# Patient Record
Sex: Female | Born: 1985 | Race: White | Hispanic: No | Marital: Single | Smoking: Former smoker
Health system: Southern US, Community
[De-identification: ages and names within clinical notes are randomized; demographics above are authoritative.]

## PROBLEM LIST (undated history)

## (undated) ENCOUNTER — Inpatient Hospital Stay (HOSPITAL_COMMUNITY): Payer: Self-pay

## (undated) DIAGNOSIS — R011 Cardiac murmur, unspecified: Secondary | ICD-10-CM

## (undated) DIAGNOSIS — T4145XA Adverse effect of unspecified anesthetic, initial encounter: Secondary | ICD-10-CM

## (undated) DIAGNOSIS — T8859XA Other complications of anesthesia, initial encounter: Secondary | ICD-10-CM

## (undated) DIAGNOSIS — Z8781 Personal history of (healed) traumatic fracture: Secondary | ICD-10-CM

## (undated) HISTORY — DX: Personal history of (healed) traumatic fracture: Z87.81

## (undated) HISTORY — DX: Adverse effect of unspecified anesthetic, initial encounter: T41.45XA

## (undated) HISTORY — PX: MOUTH SURGERY: SHX715

## (undated) HISTORY — DX: Other complications of anesthesia, initial encounter: T88.59XA

## (undated) HISTORY — PX: DENTAL SURGERY: SHX609

---

## 2005-06-27 ENCOUNTER — Inpatient Hospital Stay (HOSPITAL_COMMUNITY): Admission: AD | Admit: 2005-06-27 | Discharge: 2005-06-27 | Payer: Self-pay | Admitting: *Deleted

## 2006-11-26 ENCOUNTER — Emergency Department (HOSPITAL_COMMUNITY): Admission: EM | Admit: 2006-11-26 | Discharge: 2006-11-26 | Payer: Self-pay | Admitting: Emergency Medicine

## 2006-12-02 DIAGNOSIS — N883 Incompetence of cervix uteri: Secondary | ICD-10-CM

## 2007-08-09 ENCOUNTER — Emergency Department (HOSPITAL_COMMUNITY): Admission: EM | Admit: 2007-08-09 | Discharge: 2007-08-09 | Payer: Self-pay | Admitting: Emergency Medicine

## 2007-12-04 ENCOUNTER — Emergency Department (HOSPITAL_COMMUNITY): Admission: EM | Admit: 2007-12-04 | Discharge: 2007-12-04 | Payer: Self-pay | Admitting: Family Medicine

## 2008-07-10 ENCOUNTER — Emergency Department (HOSPITAL_COMMUNITY): Admission: EM | Admit: 2008-07-10 | Discharge: 2008-07-10 | Payer: Self-pay | Admitting: Emergency Medicine

## 2008-08-13 ENCOUNTER — Inpatient Hospital Stay (HOSPITAL_COMMUNITY): Admission: AD | Admit: 2008-08-13 | Discharge: 2008-08-14 | Payer: Self-pay | Admitting: Obstetrics and Gynecology

## 2008-10-08 ENCOUNTER — Inpatient Hospital Stay (HOSPITAL_COMMUNITY): Admission: AD | Admit: 2008-10-08 | Discharge: 2008-10-08 | Payer: Self-pay | Admitting: Obstetrics and Gynecology

## 2008-11-10 ENCOUNTER — Ambulatory Visit (HOSPITAL_COMMUNITY): Admission: RE | Admit: 2008-11-10 | Discharge: 2008-11-10 | Payer: Self-pay | Admitting: Obstetrics

## 2008-12-02 DIAGNOSIS — O321XX0 Maternal care for breech presentation, not applicable or unspecified: Secondary | ICD-10-CM

## 2008-12-11 ENCOUNTER — Inpatient Hospital Stay (HOSPITAL_COMMUNITY): Admission: AD | Admit: 2008-12-11 | Discharge: 2008-12-11 | Payer: Self-pay | Admitting: Obstetrics

## 2008-12-20 ENCOUNTER — Inpatient Hospital Stay (HOSPITAL_COMMUNITY): Admission: AD | Admit: 2008-12-20 | Discharge: 2008-12-20 | Payer: Self-pay | Admitting: Obstetrics

## 2009-01-03 ENCOUNTER — Emergency Department (HOSPITAL_COMMUNITY): Admission: EM | Admit: 2009-01-03 | Discharge: 2009-01-03 | Payer: Self-pay | Admitting: Family Medicine

## 2009-01-05 ENCOUNTER — Inpatient Hospital Stay (HOSPITAL_COMMUNITY): Admission: AD | Admit: 2009-01-05 | Discharge: 2009-01-05 | Payer: Self-pay | Admitting: Obstetrics

## 2009-01-31 ENCOUNTER — Inpatient Hospital Stay (HOSPITAL_COMMUNITY): Admission: AD | Admit: 2009-01-31 | Discharge: 2009-02-05 | Payer: Self-pay | Admitting: Obstetrics

## 2009-02-02 ENCOUNTER — Ambulatory Visit (HOSPITAL_COMMUNITY): Admission: RE | Admit: 2009-02-02 | Discharge: 2009-02-02 | Payer: Self-pay | Admitting: Obstetrics

## 2009-02-14 ENCOUNTER — Inpatient Hospital Stay (HOSPITAL_COMMUNITY): Admission: AD | Admit: 2009-02-14 | Discharge: 2009-02-14 | Payer: Self-pay | Admitting: Obstetrics

## 2009-03-11 ENCOUNTER — Inpatient Hospital Stay (HOSPITAL_COMMUNITY): Admission: AD | Admit: 2009-03-11 | Discharge: 2009-03-13 | Payer: Self-pay | Admitting: Obstetrics

## 2009-03-31 ENCOUNTER — Emergency Department (HOSPITAL_COMMUNITY): Admission: EM | Admit: 2009-03-31 | Discharge: 2009-03-31 | Payer: Self-pay | Admitting: Family Medicine

## 2010-02-06 ENCOUNTER — Emergency Department (HOSPITAL_COMMUNITY): Admission: EM | Admit: 2010-02-06 | Discharge: 2010-02-06 | Payer: Self-pay | Admitting: Family Medicine

## 2010-05-04 ENCOUNTER — Emergency Department (HOSPITAL_COMMUNITY): Admission: EM | Admit: 2010-05-04 | Discharge: 2010-05-04 | Payer: Self-pay | Admitting: Emergency Medicine

## 2010-05-07 ENCOUNTER — Emergency Department (HOSPITAL_COMMUNITY): Admission: EM | Admit: 2010-05-07 | Discharge: 2010-05-07 | Payer: Self-pay | Admitting: Family Medicine

## 2010-06-06 ENCOUNTER — Emergency Department (HOSPITAL_COMMUNITY): Admission: EM | Admit: 2010-06-06 | Discharge: 2010-06-06 | Payer: Self-pay | Admitting: Family Medicine

## 2010-06-07 ENCOUNTER — Ambulatory Visit (HOSPITAL_COMMUNITY): Admission: RE | Admit: 2010-06-07 | Discharge: 2010-06-07 | Payer: Self-pay | Admitting: Obstetrics

## 2010-06-20 ENCOUNTER — Inpatient Hospital Stay (HOSPITAL_COMMUNITY): Admission: AD | Admit: 2010-06-20 | Discharge: 2010-06-20 | Payer: Self-pay | Admitting: Obstetrics

## 2010-07-11 ENCOUNTER — Ambulatory Visit: Payer: Self-pay | Admitting: Obstetrics and Gynecology

## 2010-07-11 ENCOUNTER — Inpatient Hospital Stay (HOSPITAL_COMMUNITY): Admission: AD | Admit: 2010-07-11 | Discharge: 2010-07-11 | Payer: Self-pay | Admitting: Obstetrics

## 2010-08-14 ENCOUNTER — Inpatient Hospital Stay (HOSPITAL_COMMUNITY): Admission: AD | Admit: 2010-08-14 | Discharge: 2010-08-14 | Payer: Self-pay | Admitting: Obstetrics

## 2010-09-09 ENCOUNTER — Inpatient Hospital Stay (HOSPITAL_COMMUNITY): Admission: AD | Admit: 2010-09-09 | Discharge: 2010-09-09 | Payer: Self-pay | Admitting: Obstetrics

## 2010-09-19 ENCOUNTER — Inpatient Hospital Stay (HOSPITAL_COMMUNITY): Admission: AD | Admit: 2010-09-19 | Discharge: 2010-09-20 | Payer: Self-pay | Admitting: Obstetrics

## 2010-09-19 DIAGNOSIS — W1809XA Striking against other object with subsequent fall, initial encounter: Secondary | ICD-10-CM

## 2010-09-19 DIAGNOSIS — R109 Unspecified abdominal pain: Secondary | ICD-10-CM

## 2010-09-19 DIAGNOSIS — O9989 Other specified diseases and conditions complicating pregnancy, childbirth and the puerperium: Secondary | ICD-10-CM

## 2010-09-19 DIAGNOSIS — M545 Low back pain: Secondary | ICD-10-CM

## 2010-09-28 ENCOUNTER — Emergency Department (HOSPITAL_COMMUNITY): Admission: EM | Admit: 2010-09-28 | Discharge: 2010-09-28 | Payer: Self-pay | Admitting: Emergency Medicine

## 2010-09-28 ENCOUNTER — Emergency Department (HOSPITAL_COMMUNITY): Admission: EM | Admit: 2010-09-28 | Discharge: 2010-09-28 | Payer: Self-pay | Admitting: Family Medicine

## 2010-10-31 ENCOUNTER — Ambulatory Visit (HOSPITAL_COMMUNITY)
Admission: RE | Admit: 2010-10-31 | Discharge: 2010-10-31 | Payer: Self-pay | Source: Home / Self Care | Admitting: Obstetrics

## 2010-12-02 ENCOUNTER — Inpatient Hospital Stay (HOSPITAL_COMMUNITY)
Admission: AD | Admit: 2010-12-02 | Discharge: 2010-12-02 | Payer: Self-pay | Source: Home / Self Care | Attending: Obstetrics | Admitting: Obstetrics

## 2010-12-31 ENCOUNTER — Inpatient Hospital Stay (HOSPITAL_COMMUNITY)
Admission: AD | Admit: 2010-12-31 | Discharge: 2011-01-02 | DRG: 775 | Disposition: A | Discharge status: Home / Self Care | Payer: Medicaid Other | Attending: Obstetrics | Admitting: Obstetrics

## 2010-12-31 LAB — CBC
HCT: 38.5 % (ref 36.0–46.0)
Hemoglobin: 13 g/dL (ref 12.0–15.0)
MCH: 29.3 pg (ref 26.0–34.0)
MCHC: 33.8 g/dL (ref 30.0–36.0)
MCV: 86.9 fL (ref 78.0–100.0)
Platelets: 247 10*3/uL (ref 150–400)
RBC: 4.43 MIL/uL (ref 3.87–5.11)
RDW: 13.5 % (ref 11.5–15.5)
WBC: 13.2 10*3/uL — ABNORMAL HIGH (ref 4.0–10.5)

## 2010-12-31 LAB — RPR: RPR Ser Ql: NONREACTIVE

## 2011-01-01 LAB — CBC
HCT: 32.1 % — ABNORMAL LOW (ref 36.0–46.0)
Hemoglobin: 10.2 g/dL — ABNORMAL LOW (ref 12.0–15.0)
MCH: 28.2 pg (ref 26.0–34.0)
MCHC: 31.8 g/dL (ref 30.0–36.0)
MCV: 88.7 fL (ref 78.0–100.0)
Platelets: 216 10*3/uL (ref 150–400)
RBC: 3.62 MIL/uL — ABNORMAL LOW (ref 3.87–5.11)
RDW: 13.9 % (ref 11.5–15.5)
WBC: 9.1 10*3/uL (ref 4.0–10.5)

## 2011-01-02 LAB — RH IMMUNE GLOB WKUP(>/=20WKS)(NOT WOMEN'S HOSP)
Fetal Screen: NEGATIVE
Unit division: 0

## 2011-02-11 LAB — URINALYSIS, ROUTINE W REFLEX MICROSCOPIC
Bilirubin Urine: NEGATIVE
Glucose, UA: NEGATIVE mg/dL
Hgb urine dipstick: NEGATIVE
Ketones, ur: NEGATIVE mg/dL
Nitrite: NEGATIVE
Protein, ur: NEGATIVE mg/dL
Specific Gravity, Urine: 1.02 (ref 1.005–1.030)
Urobilinogen, UA: 0.2 mg/dL (ref 0.0–1.0)
pH: 7 (ref 5.0–8.0)

## 2011-02-12 LAB — RH IMMUNE GLOBULIN WORKUP (NOT WOMEN'S HOSP)
ABO/RH(D): O NEG
Antibody Screen: NEGATIVE
Unit division: 0

## 2011-02-13 LAB — URINE CULTURE: Culture  Setup Time: 201110282050

## 2011-02-13 LAB — POCT I-STAT, CHEM 8
BUN: 5 mg/dL — ABNORMAL LOW (ref 6–23)
Creatinine, Ser: 0.7 mg/dL (ref 0.4–1.2)
Glucose, Bld: 87 mg/dL (ref 70–99)
Hemoglobin: 11.6 g/dL — ABNORMAL LOW (ref 12.0–15.0)
Potassium: 3.6 mEq/L (ref 3.5–5.1)

## 2011-02-13 LAB — URINALYSIS, ROUTINE W REFLEX MICROSCOPIC
Bilirubin Urine: NEGATIVE
Ketones, ur: NEGATIVE mg/dL
Nitrite: NEGATIVE
Protein, ur: NEGATIVE mg/dL
Urobilinogen, UA: 1 mg/dL (ref 0.0–1.0)
pH: 7 (ref 5.0–8.0)

## 2011-02-13 LAB — POCT URINALYSIS DIPSTICK
Hgb urine dipstick: NEGATIVE
Nitrite: NEGATIVE
Specific Gravity, Urine: 1.015 (ref 1.005–1.030)
Urobilinogen, UA: 1 mg/dL (ref 0.0–1.0)
pH: 7.5 (ref 5.0–8.0)

## 2011-02-13 LAB — URINE MICROSCOPIC-ADD ON

## 2011-02-14 LAB — URINE MICROSCOPIC-ADD ON

## 2011-02-14 LAB — URINE CULTURE: Culture  Setup Time: 201109140043

## 2011-02-14 LAB — WET PREP, GENITAL: Trich, Wet Prep: NONE SEEN

## 2011-02-14 LAB — URINALYSIS, ROUTINE W REFLEX MICROSCOPIC
Glucose, UA: NEGATIVE mg/dL
Ketones, ur: NEGATIVE mg/dL
Nitrite: NEGATIVE
Specific Gravity, Urine: 1.025 (ref 1.005–1.030)
pH: 6.5 (ref 5.0–8.0)

## 2011-02-14 LAB — GC/CHLAMYDIA PROBE AMP, GENITAL: Chlamydia, DNA Probe: NEGATIVE

## 2011-02-15 LAB — URINE CULTURE
Colony Count: NO GROWTH
Culture  Setup Time: 201108102114

## 2011-02-15 LAB — CBC
HCT: 32.7 % — ABNORMAL LOW (ref 36.0–46.0)
Hemoglobin: 11.2 g/dL — ABNORMAL LOW (ref 12.0–15.0)
RBC: 3.58 MIL/uL — ABNORMAL LOW (ref 3.87–5.11)

## 2011-02-15 LAB — URINALYSIS, ROUTINE W REFLEX MICROSCOPIC
Bilirubin Urine: NEGATIVE
Ketones, ur: NEGATIVE mg/dL
Specific Gravity, Urine: >1.03 — ABNORMAL HIGH (ref 1.005–1.030)
Urobilinogen, UA: 0.2 mg/dL (ref 0.0–1.0)

## 2011-02-15 LAB — URINE MICROSCOPIC-ADD ON

## 2011-02-15 LAB — WET PREP, GENITAL: Clue Cells Wet Prep HPF POC: NONE SEEN

## 2011-02-16 LAB — URINALYSIS, ROUTINE W REFLEX MICROSCOPIC
Glucose, UA: NEGATIVE mg/dL
Specific Gravity, Urine: 1.025 (ref 1.005–1.030)
pH: 6 (ref 5.0–8.0)

## 2011-03-13 LAB — CBC
HCT: 34.5 % — ABNORMAL LOW (ref 36.0–46.0)
Hemoglobin: 11.9 g/dL — ABNORMAL LOW (ref 12.0–15.0)
Platelets: 257 10*3/uL (ref 150–400)
RBC: 3.78 MIL/uL — ABNORMAL LOW (ref 3.87–5.11)
WBC: 16.4 10*3/uL — ABNORMAL HIGH (ref 4.0–10.5)

## 2011-03-13 LAB — RH IMMUNE GLOB WKUP(>/=20WKS)(NOT WOMEN'S HOSP): Fetal Screen: NEGATIVE

## 2011-03-14 LAB — COMPREHENSIVE METABOLIC PANEL
Alkaline Phosphatase: 99 U/L (ref 39–117)
BUN: 11 mg/dL (ref 6–23)
Glucose, Bld: 101 mg/dL — ABNORMAL HIGH (ref 70–99)
Potassium: 3.4 mEq/L — ABNORMAL LOW (ref 3.5–5.1)
Total Bilirubin: 0.6 mg/dL (ref 0.3–1.2)
Total Protein: 6 g/dL (ref 6.0–8.3)

## 2011-03-14 LAB — URINALYSIS, ROUTINE W REFLEX MICROSCOPIC
Bilirubin Urine: NEGATIVE
Ketones, ur: NEGATIVE mg/dL
Nitrite: NEGATIVE
Protein, ur: NEGATIVE mg/dL
pH: 6 (ref 5.0–8.0)

## 2011-03-18 LAB — URINALYSIS, ROUTINE W REFLEX MICROSCOPIC
Bilirubin Urine: NEGATIVE
Glucose, UA: NEGATIVE mg/dL
Ketones, ur: NEGATIVE mg/dL
Nitrite: NEGATIVE
Protein, ur: NEGATIVE mg/dL
Protein, ur: NEGATIVE mg/dL
Urobilinogen, UA: 0.2 mg/dL (ref 0.0–1.0)
pH: 7 (ref 5.0–8.0)

## 2011-03-18 LAB — WET PREP, GENITAL: Yeast Wet Prep HPF POC: NONE SEEN

## 2011-03-19 LAB — RH IMMUNE GLOBULIN WORKUP (NOT WOMEN'S HOSP)

## 2011-03-19 LAB — POCT RAPID STREP A (OFFICE): Streptococcus, Group A Screen (Direct): NEGATIVE

## 2011-04-06 ENCOUNTER — Emergency Department (HOSPITAL_COMMUNITY)
Admission: EM | Admit: 2011-04-06 | Discharge: 2011-04-06 | Disposition: A | Discharge status: Home / Self Care | Payer: Medicaid Other | Attending: Emergency Medicine | Admitting: Emergency Medicine

## 2011-04-06 DIAGNOSIS — R0609 Other forms of dyspnea: Secondary | ICD-10-CM | POA: Insufficient documentation

## 2011-04-06 DIAGNOSIS — F411 Generalized anxiety disorder: Secondary | ICD-10-CM | POA: Insufficient documentation

## 2011-04-06 DIAGNOSIS — R0989 Other specified symptoms and signs involving the circulatory and respiratory systems: Secondary | ICD-10-CM | POA: Insufficient documentation

## 2011-04-06 DIAGNOSIS — R071 Chest pain on breathing: Secondary | ICD-10-CM | POA: Insufficient documentation

## 2011-04-06 DIAGNOSIS — M79609 Pain in unspecified limb: Secondary | ICD-10-CM | POA: Insufficient documentation

## 2011-04-06 DIAGNOSIS — Z86718 Personal history of other venous thrombosis and embolism: Secondary | ICD-10-CM | POA: Insufficient documentation

## 2011-04-06 DIAGNOSIS — F1010 Alcohol abuse, uncomplicated: Secondary | ICD-10-CM | POA: Insufficient documentation

## 2011-04-06 DIAGNOSIS — Z7982 Long term (current) use of aspirin: Secondary | ICD-10-CM | POA: Insufficient documentation

## 2011-04-06 DIAGNOSIS — Z79899 Other long term (current) drug therapy: Secondary | ICD-10-CM | POA: Insufficient documentation

## 2011-04-06 LAB — DIFFERENTIAL
Basophils Relative: 0 % (ref 0–1)
Eosinophils Absolute: 0.1 10*3/uL (ref 0.0–0.7)
Lymphs Abs: 3.3 10*3/uL (ref 0.7–4.0)
Neutro Abs: 7.5 10*3/uL (ref 1.7–7.7)
Neutrophils Relative %: 64 % (ref 43–77)

## 2011-04-06 LAB — POCT I-STAT, CHEM 8
Calcium, Ion: 1.08 mmol/L — ABNORMAL LOW (ref 1.12–1.32)
Chloride: 108 mEq/L (ref 96–112)
HCT: 40 % (ref 36.0–46.0)
Sodium: 145 mEq/L (ref 135–145)
TCO2: 22 mmol/L (ref 0–100)

## 2011-04-06 LAB — CBC
Platelets: 360 10*3/uL (ref 150–400)
RBC: 4.49 MIL/uL (ref 3.87–5.11)
WBC: 11.6 10*3/uL — ABNORMAL HIGH (ref 4.0–10.5)

## 2011-04-06 LAB — POCT CARDIAC MARKERS
CKMB, poc: <1 ng/mL — ABNORMAL LOW (ref 1.0–8.0)
Troponin i, poc: <0.05 ng/mL (ref 0.00–0.09)

## 2011-04-19 NOTE — Discharge Summary (Signed)
NAMEIRINE, HEMINGER             ACCOUNT NO.:  [id number]   MEDICAL RECORD NO.:  [id number]          PATIENT TYPE:  INP   LOCATION:  9158                          FACILITY:  [facility]   PHYSICIAN:  Kathreen Cosier, M.D.DATE OF BIRTH:  December 29, 1985   DATE OF ADMISSION:  01/31/2009  DATE OF DISCHARGE:  02/05/2009                               DISCHARGE SUMMARY   The patient is a 24 year old gravida 9, para 1-0-7-1, [facility] March 21, 2009, who was admitted with a viral gastroenteritis and got IV fluids.  However, heart tracing was reactive and she was having some variable  decelerations and placed on magnesium sulfate.  She also had 1 episode  of a 3-minute deceleration.  An ultrasound performed was normal.  She  received betamethasone 12.5 mg IM and that was repeated in 24 hours.  The magnesium was discontinued on February 04, 2009 and she was started on  Procardia 30 XL b.i.d.  She was also on IV ampicillin and erythromycin  and that was stopped on February 05, 2009 when her contractions stopped.  The tracing was reactive.  She was sent home on Procardia 30 mg 1  b.i.d., ampicillin 500, and to have a nonstress test done twice weekly  at [facility], because of the variable decelerations that she had.   DISCHARGE DIAGNOSES:  1. Status post premature contractions.  2. Viral gastroenteritis.           ______________________________  Kathreen Cosier, M.D.     BAM/MEDQ  D:  02/22/2009  T:  02/22/2009  Job:  [id number]

## 2011-04-26 ENCOUNTER — Emergency Department (HOSPITAL_COMMUNITY)
Admission: EM | Admit: 2011-04-26 | Discharge: 2011-04-26 | Disposition: A | Discharge status: Home / Self Care | Payer: Medicaid Other | Attending: Emergency Medicine | Admitting: Emergency Medicine

## 2011-04-26 DIAGNOSIS — H9209 Otalgia, unspecified ear: Secondary | ICD-10-CM | POA: Insufficient documentation

## 2011-04-26 DIAGNOSIS — H6690 Otitis media, unspecified, unspecified ear: Secondary | ICD-10-CM | POA: Insufficient documentation

## 2011-04-26 DIAGNOSIS — J3489 Other specified disorders of nose and nasal sinuses: Secondary | ICD-10-CM | POA: Insufficient documentation

## 2011-04-26 DIAGNOSIS — J329 Chronic sinusitis, unspecified: Secondary | ICD-10-CM | POA: Insufficient documentation

## 2011-08-30 LAB — DIFFERENTIAL
Eosinophils Relative: 1
Lymphocytes Relative: 31
Lymphs Abs: 2.8
Monocytes Absolute: 0.7

## 2011-08-30 LAB — POCT CARDIAC MARKERS
CKMB, poc: <1 — ABNORMAL LOW
Myoglobin, poc: 60
Troponin i, poc: <0.05

## 2011-08-30 LAB — URINALYSIS, ROUTINE W REFLEX MICROSCOPIC
Bilirubin Urine: NEGATIVE
Glucose, UA: NEGATIVE
Ketones, ur: NEGATIVE
pH: 7

## 2011-08-30 LAB — CBC
HCT: 40.3
Hemoglobin: 13.8
RBC: 4.57
WBC: 9.1

## 2011-08-30 LAB — POCT I-STAT, CHEM 8
BUN: 7
Calcium, Ion: 1.14
Chloride: 106
Potassium: 3 — ABNORMAL LOW
Sodium: 140

## 2011-08-30 LAB — URINE MICROSCOPIC-ADD ON

## 2011-08-30 LAB — RAPID URINE DRUG SCREEN, HOSP PERFORMED
Opiates: NOT DETECTED
Tetrahydrocannabinol: NOT DETECTED

## 2011-08-30 LAB — ETHANOL: Alcohol, Ethyl (B): <5

## 2011-09-03 LAB — RAPID URINE DRUG SCREEN, HOSP PERFORMED
Barbiturates: NOT DETECTED
Opiates: NOT DETECTED

## 2011-09-03 LAB — URINALYSIS, ROUTINE W REFLEX MICROSCOPIC
Glucose, UA: NEGATIVE
Specific Gravity, Urine: 1.01
pH: 6.5

## 2011-09-03 LAB — WET PREP, GENITAL
Clue Cells Wet Prep HPF POC: NONE SEEN
Yeast Wet Prep HPF POC: NONE SEEN

## 2011-09-04 LAB — URINALYSIS, ROUTINE W REFLEX MICROSCOPIC
Glucose, UA: NEGATIVE
Protein, ur: NEGATIVE

## 2011-09-04 LAB — CBC
Hemoglobin: 11.7 — ABNORMAL LOW
MCHC: 34.2
MCV: 89.8
RBC: 3.81 — ABNORMAL LOW

## 2011-09-04 LAB — GC/CHLAMYDIA PROBE AMP, GENITAL: GC Probe Amp, Genital: NEGATIVE

## 2011-09-04 LAB — WET PREP, GENITAL: Trich, Wet Prep: NONE SEEN

## 2011-09-13 LAB — I-STAT 8, (EC8 V) (CONVERTED LAB)
Acid-base deficit: 1
BUN: 8
Bicarbonate: 23.8
HCT: 40
Operator id: 279831
pCO2, Ven: 41.1 — ABNORMAL LOW
pH, Ven: 7.37 — ABNORMAL HIGH

## 2011-09-13 LAB — POCT I-STAT CREATININE: Creatinine, Ser: 0.8

## 2011-09-13 LAB — CBC
HCT: 36.5
Hemoglobin: 12.5
MCV: 86.8
RBC: 4.21
WBC: 7.1

## 2011-09-13 LAB — RAPID URINE DRUG SCREEN, HOSP PERFORMED
Amphetamines: NOT DETECTED
Tetrahydrocannabinol: NOT DETECTED

## 2011-09-13 LAB — DIFFERENTIAL
Eosinophils Absolute: 0
Eosinophils Relative: 0
Lymphs Abs: 1.1
Monocytes Relative: 5

## 2012-05-02 ENCOUNTER — Emergency Department (HOSPITAL_COMMUNITY): Payer: Medicaid Other

## 2012-05-02 ENCOUNTER — Other Ambulatory Visit: Payer: Self-pay

## 2012-05-02 ENCOUNTER — Encounter (HOSPITAL_COMMUNITY): Payer: Self-pay | Admitting: Emergency Medicine

## 2012-05-02 ENCOUNTER — Emergency Department (HOSPITAL_COMMUNITY)
Admission: EM | Admit: 2012-05-02 | Discharge: 2012-05-02 | Disposition: A | Discharge status: Home / Self Care | Payer: Medicaid Other | Attending: Emergency Medicine | Admitting: Emergency Medicine

## 2012-05-02 DIAGNOSIS — R0781 Pleurodynia: Secondary | ICD-10-CM

## 2012-05-02 DIAGNOSIS — R079 Chest pain, unspecified: Secondary | ICD-10-CM | POA: Insufficient documentation

## 2012-05-02 DIAGNOSIS — F17200 Nicotine dependence, unspecified, uncomplicated: Secondary | ICD-10-CM | POA: Insufficient documentation

## 2012-05-02 HISTORY — DX: Cardiac murmur, unspecified: R01.1

## 2012-05-02 MED ORDER — KETOROLAC TROMETHAMINE 30 MG/ML IJ SOLN
30.0000 mg | Freq: Once | INTRAMUSCULAR | Status: AC
  Administered 2012-05-02: 30 mg via INTRAVENOUS
  Filled 2012-05-02: qty 1

## 2012-05-02 MED ORDER — OXYCODONE-ACETAMINOPHEN 5-325 MG PO TABS: 2.0000 | ORAL_TABLET | ORAL | Status: AC | PRN

## 2012-05-02 MED ORDER — MORPHINE SULFATE 4 MG/ML IJ SOLN
4.0000 mg | Freq: Once | INTRAMUSCULAR | Status: AC
  Administered 2012-05-02: 4 mg via INTRAVENOUS
  Filled 2012-05-02: qty 1

## 2012-05-02 NOTE — ED Provider Notes (Signed)
History     CSN: [id number]  Arrival date & time 05/02/12  4098   First MD Initiated Contact with Patient 05/02/12 1008      Chief Complaint  Patient presents with  . Chest Pain    (Consider location/radiation/quality/duration/timing/severity/associated sxs/prior treatment) Patient is a 26 y.o. female presenting with chest pain. The history is provided by the patient.  Chest Pain Pertinent negatives for primary symptoms include no fever, no shortness of breath, no cough, no nausea and no vomiting.  Pertinent negatives for associated symptoms include no diaphoresis.   30 y female. No pmh.  C/o left sided pleuritic cp since she woke this am.  No f/c/cough/sob. Smokes. No ocps or estrogens.  No recent travel or surgery.  No leg pain or swelling.  No other pain. No hx of similar sxs.  Past Medical History  Diagnosis Date  . Heart murmur     History reviewed. No pertinent past surgical history.  No family history on file.  History  Substance Use Topics  . Smoking status: Current Everyday Smoker    Types: Cigarettes  . Smokeless tobacco: Not on file  . Alcohol Use: No    OB History    Grav Para Term Preterm Abortions TAB SAB Ect Mult Living                  Review of Systems  Constitutional: Negative for fever, chills and diaphoresis.  HENT: Negative for congestion.   Respiratory: Negative for cough, chest tightness and shortness of breath.   Cardiovascular: Positive for chest pain. Negative for leg swelling.  Gastrointestinal: Negative for nausea and vomiting.  Skin: Negative for rash.  Neurological: Negative for headaches.  Psychiatric/Behavioral: Negative for confusion.  All other systems reviewed and are negative.    Allergies  Onion; Almond oil; and Soy allergy  Home Medications   Current Outpatient Rx  Name Route Sig Dispense Refill  . PRESCRIPTION MEDICATION Oral Take 1 tablet by mouth 3 (three) times daily. Amoxicillin.  First dose approximately  04/20/2012.  Unsure of duration.      BP 108/65  Pulse 87  Temp(Src) 98 F (36.7 C) (Oral)  Resp 16  SpO2 99%  LMP 04/15/2012  Physical Exam  Nursing note and vitals reviewed. Constitutional: She is oriented to person, place, and time. She appears well-developed and well-nourished. No distress.  HENT:  Head: Normocephalic and atraumatic.  Eyes: Conjunctivae are normal.  Neck: Normal range of motion. Neck supple.  Cardiovascular: Normal rate.   No murmur heard. Pulmonary/Chest: Effort normal. No respiratory distress. She has no wheezes. She has no rales. She exhibits no tenderness.  Abdominal: Soft. There is no tenderness.  Musculoskeletal: Normal range of motion. She exhibits no edema.  Neurological: She is alert and oriented to person, place, and time.  Skin: Skin is warm and dry.  Psychiatric: She has a normal mood and affect. Thought content normal.    ED Course  Procedures (including critical care time)26 y female smoker with left sided pleuritic cp. No other sxs.  No risk factors for pe.  No sxs to suggest pneumonia.   Lungs cta bilat. Doubt ptx.  Will check cxr and ddimer.   Labs Reviewed  D-DIMER, QUANTITATIVE   No results found.   No diagnosis found.   Date: 05/02/2012  Rate: 85  Rhythm: normal sinus rhythm  QRS Axis: normal  Intervals: normal  ST/T Wave abnormalities: normal  Conduction Disutrbances: none  Narrative Interpretation: unremarkable  MDM  Pleuritic cp No pneumonia, pneumothorax.  Evidence of pulmonary embolism, or other significant illness.       Cheri Guppy, MD 05/02/12 1116

## 2012-05-02 NOTE — ED Notes (Signed)
Pt. Stated I woke up this morning at 745 with a sharp pain in my chest that radiated to the left like it was shooting . I'm also SOB and the pain is going to the lt arm with tingling and numb.

## 2012-05-02 NOTE — Discharge Instructions (Signed)
Your chest x-ray, blood tests, and EKG do not show any significant illness.  Use ibuprofen 800 mg every 8 hours for pain.  Use Percocet for more severe pain.  Stop smoking.  Followup with your Dr. if your symptoms.  Last more than 2-3 days.  Return for worse or uncontrolled symptoms

## 2012-05-02 NOTE — ED Notes (Signed)
Patient transported to X-ray 

## 2012-05-23 ENCOUNTER — Observation Stay (HOSPITAL_COMMUNITY)
Admission: EM | Admit: 2012-05-23 | Discharge: 2012-05-24 | Disposition: A | Discharge status: Home / Self Care | Payer: Medicaid Other | Source: Ambulatory Visit | Attending: Emergency Medicine | Admitting: Emergency Medicine

## 2012-05-23 ENCOUNTER — Encounter (HOSPITAL_COMMUNITY): Payer: Self-pay | Admitting: Emergency Medicine

## 2012-05-23 DIAGNOSIS — M549 Dorsalgia, unspecified: Secondary | ICD-10-CM | POA: Insufficient documentation

## 2012-05-23 DIAGNOSIS — R109 Unspecified abdominal pain: Secondary | ICD-10-CM | POA: Insufficient documentation

## 2012-05-23 DIAGNOSIS — E119 Type 2 diabetes mellitus without complications: Secondary | ICD-10-CM | POA: Insufficient documentation

## 2012-05-23 DIAGNOSIS — N12 Tubulo-interstitial nephritis, not specified as acute or chronic: Principal | ICD-10-CM | POA: Insufficient documentation

## 2012-05-23 DIAGNOSIS — R6883 Chills (without fever): Secondary | ICD-10-CM | POA: Insufficient documentation

## 2012-05-23 LAB — DIFFERENTIAL
Basophils Absolute: 0 10*3/uL (ref 0.0–0.1)
Eosinophils Relative: 0 % (ref 0–5)
Lymphocytes Relative: 10 % — ABNORMAL LOW (ref 12–46)
Monocytes Absolute: 1.1 10*3/uL — ABNORMAL HIGH (ref 0.1–1.0)
Monocytes Relative: 8 % (ref 3–12)

## 2012-05-23 LAB — CBC
Hemoglobin: 12.4 g/dL (ref 12.0–15.0)
MCH: 29.3 pg (ref 26.0–34.0)
MCHC: 32.7 g/dL (ref 30.0–36.0)
Platelets: 288 10*3/uL (ref 150–400)
RDW: 13.8 % (ref 11.5–15.5)

## 2012-05-23 LAB — URINALYSIS, ROUTINE W REFLEX MICROSCOPIC
Glucose, UA: NEGATIVE mg/dL
Ketones, ur: NEGATIVE mg/dL
Specific Gravity, Urine: 1.016 (ref 1.005–1.030)
pH: 6.5 (ref 5.0–8.0)

## 2012-05-23 LAB — URINE MICROSCOPIC-ADD ON

## 2012-05-23 LAB — POCT I-STAT, CHEM 8
BUN: 8 mg/dL (ref 6–23)
Calcium, Ion: 1.17 mmol/L (ref 1.12–1.32)
Glucose, Bld: 102 mg/dL — ABNORMAL HIGH (ref 70–99)
TCO2: 23 mmol/L (ref 0–100)

## 2012-05-23 MED ORDER — ACETAMINOPHEN 325 MG PO TABS
650.0000 mg | ORAL_TABLET | ORAL | Status: DC | PRN
  Administered 2012-05-24: 650 mg via ORAL
  Filled 2012-05-23: qty 2

## 2012-05-23 MED ORDER — ONDANSETRON HCL 4 MG/2ML IJ SOLN
4.0000 mg | Freq: Once | INTRAMUSCULAR | Status: AC
  Administered 2012-05-23: 4 mg via INTRAVENOUS
  Filled 2012-05-23: qty 2

## 2012-05-23 MED ORDER — HYDROMORPHONE HCL PF 1 MG/ML IJ SOLN
1.0000 mg | Freq: Four times a day (QID) | INTRAMUSCULAR | Status: DC | PRN
  Administered 2012-05-24 (×2): 1 mg via INTRAVENOUS
  Filled 2012-05-23 (×2): qty 1

## 2012-05-23 MED ORDER — SODIUM CHLORIDE 0.9 % IV SOLN
INTRAVENOUS | Status: DC
Start: 2012-05-23 — End: 2012-05-23
  Administered 2012-05-23 (×2): 125 mL/h via INTRAVENOUS

## 2012-05-23 MED ORDER — SODIUM CHLORIDE 0.9 % IV BOLUS (SEPSIS)
1000.0000 mL | Freq: Once | INTRAVENOUS | Status: AC
  Administered 2012-05-23: 1000 mL via INTRAVENOUS

## 2012-05-23 MED ORDER — MORPHINE SULFATE 4 MG/ML IJ SOLN
4.0000 mg | Freq: Once | INTRAMUSCULAR | Status: AC
  Administered 2012-05-23: 4 mg via INTRAVENOUS
  Filled 2012-05-23: qty 1

## 2012-05-23 MED ORDER — DEXTROSE 5 % IV SOLN
1.0000 g | Freq: Two times a day (BID) | INTRAVENOUS | Status: DC
  Administered 2012-05-23 – 2012-05-24 (×2): 1 g via INTRAVENOUS
  Filled 2012-05-23 (×2): qty 10

## 2012-05-23 MED ORDER — ONDANSETRON HCL 4 MG/2ML IJ SOLN
4.0000 mg | Freq: Four times a day (QID) | INTRAMUSCULAR | Status: DC | PRN
  Administered 2012-05-24: 4 mg via INTRAVENOUS
  Filled 2012-05-23: qty 2

## 2012-05-23 MED ORDER — SODIUM CHLORIDE 0.9 % IV SOLN
1000.0000 mL | INTRAVENOUS | Status: DC
  Administered 2012-05-24 (×2): 1000 mL via INTRAVENOUS

## 2012-05-23 MED ORDER — DEXTROSE 5 % IV SOLN
1.0000 g | Freq: Once | INTRAVENOUS | Status: AC
  Administered 2012-05-23: 1 g via INTRAVENOUS
  Filled 2012-05-23: qty 10

## 2012-05-23 MED ORDER — MORPHINE SULFATE 4 MG/ML IJ SOLN
6.0000 mg | Freq: Once | INTRAMUSCULAR | Status: AC
  Administered 2012-05-23: 6 mg via INTRAVENOUS
  Filled 2012-05-23 (×2): qty 1

## 2012-05-23 NOTE — ED Provider Notes (Signed)
History     CSN: [id number]  Arrival date & time 05/23/12  1419   First MD Initiated Contact with Patient 05/23/12 1630      Chief Complaint  Patient presents with  . Flank Pain    right side  . Back Pain    right side  . Chills    (Consider location/radiation/quality/duration/timing/severity/associated sxs/prior treatment) Patient is a 26 y.o. female presenting with flank pain. The history is provided by the patient and medical records.  Flank Pain Associated symptoms include abdominal pain. Pertinent negatives include no chest pain, no headaches and no shortness of breath.   26 year old, female, presents emergency department complaining of right flank pain, with nausea, and shaking, chills.  She said she had urinary tract symptoms.  Last week.  She denies, vomiting, diarrhea, cough, or shortness of breath.  Past Medical History  Diagnosis Date  . Heart murmur   . Diabetes mellitus     History reviewed. No pertinent past surgical history.  History reviewed. No pertinent family history.  History  Substance Use Topics  . Smoking status: Current Everyday Smoker    Types: Cigarettes  . Smokeless tobacco: Not on file  . Alcohol Use: No    OB History    Grav Para Term Preterm Abortions TAB SAB Ect Mult Living                  Review of Systems  Constitutional: Positive for chills. Negative for fever.  Respiratory: Negative for cough and shortness of breath.   Cardiovascular: Negative for chest pain.  Gastrointestinal: Positive for nausea and abdominal pain. Negative for vomiting and diarrhea.  Genitourinary: Positive for dysuria and flank pain. Negative for hematuria and vaginal discharge.  Musculoskeletal: Positive for back pain.  Neurological: Negative for headaches.  Psychiatric/Behavioral: Negative for confusion.  All other systems reviewed and are negative.    Allergies  Onion; Almond oil; and Soy allergy  Home Medications   Current Outpatient Rx    Name Route Sig Dispense Refill  . AMOXICILLIN 500 MG PO CAPS Oral Take 500 mg by mouth 3 (three) times daily.    . IBUPROFEN 800 MG PO TABS Oral Take 800 mg by mouth every 8 (eight) hours as needed. For pain.      BP 97/43  Pulse 128  Temp 98.9 F (37.2 C) (Oral)  Resp 18  SpO2 98%  LMP 05/17/2012  Physical Exam  Vitals reviewed. Constitutional: She is oriented to person, place, and time. She appears well-developed and well-nourished. No distress.  HENT:  Head: Normocephalic and atraumatic.  Eyes: Conjunctivae are normal.  Neck: Normal range of motion. Neck supple.  Cardiovascular: Regular rhythm.   No murmur heard.      Tachycardia  Pulmonary/Chest: Effort normal. She has no rales.  Abdominal: Soft. There is tenderness. There is no rebound and no guarding.        Right upper quadrant tenderness  Genitourinary:       Right flank tenderness  Musculoskeletal: Normal range of motion.  Neurological: She is alert and oriented to person, place, and time.  Skin: Skin is warm and dry.  Psychiatric: She has a normal mood and affect. Thought content normal.    ED Course  Procedures (including critical care time) 26 showed, female, with no significant past medical history presents emergency department with symptoms consistent with pyelonephritis.  We'll perform laboratory testing, and give antibiotics and analgesics  Labs Reviewed  URINALYSIS, ROUTINE W REFLEX MICROSCOPIC - Abnormal; Notable  for the following:    APPearance TURBID (*)     Hgb urine dipstick LARGE (*)     Protein, ur 100 (*)     Nitrite POSITIVE (*)     Leukocytes, UA LARGE (*)     All other components within normal limits  URINE MICROSCOPIC-ADD ON - Abnormal; Notable for the following:    Bacteria, UA MANY (*)     All other components within normal limits  POCT I-STAT, CHEM 8 - Abnormal; Notable for the following:    Glucose, Bld 102 (*)     All other components within normal limits  CBC  DIFFERENTIAL    No results found.   No diagnosis found.    MDM  Pyelonephritis        Cheri Guppy, MD 05/23/12 2340

## 2012-05-23 NOTE — ED Notes (Signed)
Pt reports back pain, flank pain, chills x 1 week.

## 2012-05-23 NOTE — ED Notes (Signed)
Report given to Audrey RN

## 2012-05-23 NOTE — ED Provider Notes (Addendum)
History     CSN: [id number]  Arrival date & time 05/23/12  1419   First MD Initiated Contact with Patient 05/23/12 1630      Chief Complaint  Patient presents with  . Flank Pain    right side  . Back Pain    right side  . Chills    (Consider location/radiation/quality/duration/timing/severity/associated sxs/prior treatment) HPI  Past Medical History  Diagnosis Date  . Heart murmur   . Diabetes mellitus     History reviewed. No pertinent past surgical history.  History reviewed. No pertinent family history.  History  Substance Use Topics  . Smoking status: Current Everyday Smoker    Types: Cigarettes  . Smokeless tobacco: Not on file  . Alcohol Use: No    OB History    Grav Para Term Preterm Abortions TAB SAB Ect Mult Living                  Review of Systems  Allergies  Onion; Almond oil; and Soy allergy  Home Medications   Current Outpatient Rx  Name Route Sig Dispense Refill  . AMOXICILLIN 500 MG PO CAPS Oral Take 500 mg by mouth 3 (three) times daily.    . IBUPROFEN 800 MG PO TABS Oral Take 800 mg by mouth every 8 (eight) hours as needed. For pain.      BP 97/43  Pulse 128  Temp 98.5 F (36.9 C) (Oral)  Resp 18  SpO2 98%  LMP 05/17/2012  Physical Exam  ED Course  Procedures (including critical care time)  Labs Reviewed  URINALYSIS, ROUTINE W REFLEX MICROSCOPIC - Abnormal; Notable for the following:    APPearance TURBID (*)     Hgb urine dipstick LARGE (*)     Protein, ur 100 (*)     Nitrite POSITIVE (*)     Leukocytes, UA LARGE (*)     All other components within normal limits  URINE MICROSCOPIC-ADD ON - Abnormal; Notable for the following:    Bacteria, UA MANY (*)     All other components within normal limits  CBC - Abnormal; Notable for the following:    WBC 13.8 (*)     All other components within normal limits  DIFFERENTIAL - Abnormal; Notable for the following:    Neutrophils Relative 81 (*)     Neutro Abs 11.2 (*)     Lymphocytes Relative 10 (*)     Monocytes Absolute 1.1 (*)     All other components within normal limits  POCT I-STAT, CHEM 8 - Abnormal; Notable for the following:    Glucose, Bld 102 (*)     All other components within normal limits   No results found.   No diagnosis found.  9:54 PM Patient seen and examined. Pt from Pod B/D, handoff from Dr. Weldon Inches. Patient with pyelonephritis, nausea, no vomiting. Will try to improve symptoms and d/c on appropriate medications.   Vital signs reviewed and are as follows: Filed Vitals:   05/23/12 2150  BP:   Pulse:   Temp: 98.5 F (36.9 C)  Resp:   BP 97/43  Pulse 128  Temp 98.5 F (36.9 C) (Oral)  Resp 18  SpO2 98%  LMP 05/17/2012   9:55 PM Exam:  Gen NAD; Heart mild tachycardia, nml S1,S2, no m/r/g; Lungs CTAB; Abd soft, RLQ pain, R CVA tenderness, no rebound or guarding; Ext 2+ pedal pulses bilaterally, no edema.  11:08 PM Handoff to Dr. Nicanor Alcon.   Plan: D/c home on  abx, pain/nausea medication when improved. She is tolerating PO's.  BP 123/79  Pulse 110  Temp 98.5 F (36.9 C) (Oral)  Resp 18  SpO2 97%  LMP 05/17/2012    11:16 AM Patient improved overnight. Nausea controlled, she is tolerating PO's. Continues to have R lower back and flank pain. Will transition to oral pain medicines. Patient is agreeable to discharge home.   Patient to return with persistent high fever, persistent vomiting, other concerns.   Exam:  Gen NAD; Heart RRR, nml S1,S2, no m/r/g; Lungs CTAB; Abd soft, R CVA tenderness, no rebound or guarding; Ext 2+ pedal pulses bilaterally, no edema.     MDM  Pyelo protocol -- symptom control. D/c when feeling better. She has been tolerating PO's.   Re-exam the following morning, HR normal, patient appears well, she has been eating and drinking well. She is appropriate for d/c to home.     Renne Crigler, [place] 05/23/12 2309   Renne Crigler, [place] 05/24/12 1123

## 2012-05-23 NOTE — ED Notes (Signed)
EMT Thayer Ohm gave the patient a cup of ice.

## 2012-05-23 NOTE — ED Provider Notes (Signed)
Medical screening examination/treatment/procedure(s) were conducted as a shared visit with non-physician practitioner(s) and myself.  I personally evaluated the patient during the encounter  Cheri Guppy, MD 05/23/12 2337

## 2012-05-23 NOTE — ED Notes (Signed)
I gave the patient a warm blanket. 

## 2012-05-24 MED ORDER — ONDANSETRON 8 MG PO TBDP: 8.0000 mg | ORAL_TABLET | Freq: Three times a day (TID) | ORAL | Status: AC | PRN

## 2012-05-24 MED ORDER — ONDANSETRON 4 MG PO TBDP
8.0000 mg | ORAL_TABLET | Freq: Once | ORAL | Status: AC
  Administered 2012-05-24: 8 mg via ORAL
  Filled 2012-05-24: qty 2

## 2012-05-24 MED ORDER — CIPROFLOXACIN HCL 500 MG PO TABS: 500.0000 mg | ORAL_TABLET | Freq: Two times a day (BID) | ORAL | Status: AC

## 2012-05-24 MED ORDER — CIPROFLOXACIN IN D5W 400 MG/200ML IV SOLN
400.0000 mg | Freq: Once | INTRAVENOUS | Status: AC
  Administered 2012-05-24: 400 mg via INTRAVENOUS
  Filled 2012-05-24: qty 200

## 2012-05-24 MED ORDER — OXYCODONE-ACETAMINOPHEN 5-325 MG PO TABS: 1.0000 | ORAL_TABLET | Freq: Four times a day (QID) | ORAL | Status: AC | PRN

## 2012-05-24 MED ORDER — OXYCODONE-ACETAMINOPHEN 5-325 MG PO TABS
2.0000 | ORAL_TABLET | Freq: Once | ORAL | Status: AC
  Administered 2012-05-24: 2 via ORAL
  Filled 2012-05-24: qty 2

## 2012-05-24 MED ORDER — SODIUM CHLORIDE 0.9 % IV SOLN
Freq: Once | INTRAVENOUS | Status: AC
  Administered 2012-05-24: 03:00:00 via INTRAVENOUS

## 2012-05-24 NOTE — Discharge Instructions (Signed)
Please read and follow all provided instructions.  Your diagnoses today include:  1. Pyelonephritis     Tests performed today include:  Urine test - shows infection  Blood counts and electrolytes  Vital signs. See below for your results today.   Medications prescribed:   Percocet (oxycodone/acetaminophen) - narcotic pain medication  You have been prescribed narcotic pain medication such as Vicodin or Percocet: DO NOT drive or perform any activities that require you to be awake and alert because this medicine can make you drowsy. BE VERY CAREFUL not to take multiple medicines containing Tylenol (also called acetaminophen). Doing so can lead to an overdose which can damage your liver and cause liver failure and possibly death.    Ciprofloxacin - antibiotic for kidney infection  You have been prescribed an antibiotic medicine: take the entire course of medicine even if you are feeling better. Stopping early can cause the antibiotic not to work.   Zofran (ondansetron) - for nausea and vomiting  Take any prescribed medications only as directed.  Home care instructions:  Follow any educational materials contained in this packet.  BE VERY CAREFUL not to take multiple medicines containing Tylenol (also called acetaminophen). Doing so can lead to an overdose which can damage your liver and cause liver failure and possibly death.   Follow-up instructions: Please follow-up with your primary care provider in the next 3 days for further evaluation of your symptoms. If you do not have a primary care doctor -- see below for referral information.   Return instructions:   Please return to the Emergency Department if you experience worsening symptoms.   Return with persistent fever, persistent vomiting, uncontrolled pain, if you have a new, changing, or different pain  Please return if you have any other emergent concerns.  Additional Information:  Your vital signs today were: BP  106/62  Pulse 87  Temp 97.9 F (36.6 C) (Oral)  Resp 16  SpO2 95%  LMP 05/17/2012 If your blood pressure (BP) was elevated above 135/85 this visit, please have this repeated by your doctor within one month. -------------- No Primary Care Doctor Call [facility]  [phone] Other agencies that provide inexpensive medical care    [facility]  [phone]    [facility]  [phone]    [facility]  [phone]    [facility]  [phone]    [facility]  [phone]    [facility]  [phone] -------------- RESOURCE GUIDE:  Dental Problems  Patients with Medicaid: [facility] [phone] [address]                                            [phone] [address] Phone:  [phone]                                                   Phone:  [phone]  If unable to pay or uninsured, contact:  [facility] or [facility]. to become qualified for the adult dental clinic.  Chronic Pain Problems Contact [facility]  [phone] Patients need to be referred by their primary care doctor.  Insufficient Money for Medicine Contact [employer]:  call "211" or [facility] [phone].  Psychological Services [facility] Behavioral [facility]  [phone] [facility]  [phone] [facility]   [phone] (emergency services [phone])  Substance Abuse Resources [facility]  [phone] [facility] [phone] [facility] [phone] Floydene Flock [phone] Residential & Outpatient Substance Abuse Program  [phone]  Abuse/Neglect [facility] Child Abuse Hotline [phone] 550-5494 [facility] Child Abuse Hotline (914)696-5251 (After Hours)  Emergency Shelter [facility] 646-170-8085  M[facility] [phone] [place] [facility] [phone]  [facility] Resources  [facility] [place]     [employer]                          [facility] Dept. [address] [place]                       [address]      [address]  Blondell Reveal Phone:  [phone]                                   Phone:  [phone]                 Phone:  [phone]  [facility] Mental Health Phone:  [phone]  [facility] Child Abuse Hotline [phone] (After Hours)

## 2012-06-18 ENCOUNTER — Encounter (HOSPITAL_COMMUNITY): Payer: Self-pay | Admitting: Emergency Medicine

## 2012-06-18 ENCOUNTER — Emergency Department (HOSPITAL_COMMUNITY)
Admission: EM | Admit: 2012-06-18 | Discharge: 2012-06-18 | Disposition: A | Discharge status: Home / Self Care | Payer: Medicaid Other | Source: Home / Self Care | Attending: Emergency Medicine | Admitting: Emergency Medicine

## 2012-06-18 DIAGNOSIS — M273 Alveolitis of jaws: Secondary | ICD-10-CM

## 2012-06-18 MED ORDER — DICLOFENAC SODIUM 75 MG PO TBEC: 75.0000 mg | DELAYED_RELEASE_TABLET | Freq: Two times a day (BID) | ORAL | Status: AC

## 2012-06-18 MED ORDER — OXYCODONE-ACETAMINOPHEN 5-325 MG PO TABS: ORAL_TABLET | ORAL | Status: AC

## 2012-06-18 MED ORDER — KETOCONAZOLE 2 % EX CREA: TOPICAL_CREAM | Freq: Every day | CUTANEOUS | Status: AC

## 2012-06-18 MED ORDER — CLINDAMYCIN HCL 300 MG PO CAPS: 300.0000 mg | ORAL_CAPSULE | Freq: Four times a day (QID) | ORAL | Status: AC

## 2012-06-18 NOTE — ED Notes (Signed)
Pt stated 06/10/12 had 5 teeth extracted. Has had pain since. Had appt with Dr. Teola Bradley surgeon today but appt had to be canceled per his office due to family emergency.  Pt went to her PCP but wasn't treated nicely and wasn't getting seen if possible infection in lower jaw.

## 2012-06-18 NOTE — ED Provider Notes (Signed)
Chief Complaint  Patient presents with  . Dental Pain    History of Present Illness:   Sabrina Griffin is a 26 year old female who had oral surgery on Wednesday, July 10, done by Dr. Lovell Sheehan. She had 5 extractions because of abscess. She was treated with amoxicillin beginning 2 days before the extractions and then afterwards up until 2 days ago. For the past one week she's had pain in the 2 sockets on the mandible that were extracted, her right second molar and her left second premolar. She's been running a slight fever of up to 100.5. There's been no swelling internally or externally. Because of the pain she's had difficulty eating has lost about 8 pounds over the past week. She called her oral surgeon and he was unable to see her because of a family emergency. She tried going to her primary care physician, but felt humiliated because they accused her of being a drug seeker. She has no difficulty swallowing or breathing. She denies any headache, facial pain, eye pain, or swelling of the neck. No coughing, wheezing, chest pain, or shortness of breath.  Review of Systems:  Other than noted above, the patient denies any of the following symptoms: Systemic:  No fever, chills, sweats or weight loss. ENT:  No headache, ear ache, sore throat, nasal congestion, facial pain, or swelling. Lymphatic:  No adenopathy. Lungs:  No coughing, wheezing or shortness of breath.  PMFSH:  Past medical history, family history, social history, meds, and allergies were reviewed.  Physical Exam:   Vital signs:  BP 132/92  Pulse 114  Temp 98.6 F (37 C) (Oral)  SpO2 97%  LMP 06/15/2012 General:  Alert, oriented, in no distress. ENT:  TMs and canals normal.  Nasal mucosa normal. Mouth exam:  She has 5 extractions, all of the 3 upper extractions are healing up well. Both of the lower extractions as documented above have dry sockets, with necrotic debris, particularly in the left premolar. There is no swelling of the gingiva,  no swelling of the floor the mouth, and the pharynx is clear. Neck:  No swelling or adenopathy. Lungs:  Breath sounds clear and equal bilaterally.  No wheezes, rales or rhonchi. Heart:  Regular rhythm.  No gallops or murmers. Skin:  Clear, warm and dry.  Assessment:  The encounter diagnosis was Dry socket.  Plan:   1.  The following meds were prescribed:   New Prescriptions   CLINDAMYCIN (CLEOCIN) 300 MG CAPSULE    Take 1 capsule (300 mg total) by mouth 4 (four) times daily.   DICLOFENAC (VOLTAREN) 75 MG EC TABLET    Take 1 tablet (75 mg total) by mouth 2 (two) times daily.   KETOCONAZOLE (NIZORAL) 2 % CREAM    Apply topically daily.   OXYCODONE-ACETAMINOPHEN (PERCOCET) 5-325 MG PER TABLET    1 to 2 tablets every 6 hours as needed for pain.   2.  The patient was instructed in symptomatic care and handouts were given. 3.  The patient was told to return if becoming worse in any way, if no better in 3 or 4 days, and given some red flag symptoms that would indicate earlier return, especially difficulty breathing. 4.  The patient was told to follow up with a dentist as soon as possible.    Reuben Likes, MD 06/18/12 2039

## 2012-09-01 ENCOUNTER — Emergency Department (INDEPENDENT_AMBULATORY_CARE_PROVIDER_SITE_OTHER): Payer: Medicaid Other

## 2012-09-01 ENCOUNTER — Emergency Department (INDEPENDENT_AMBULATORY_CARE_PROVIDER_SITE_OTHER)
Admission: EM | Admit: 2012-09-01 | Discharge: 2012-09-01 | Disposition: A | Discharge status: Home / Self Care | Payer: Medicaid Other | Source: Home / Self Care | Attending: Emergency Medicine | Admitting: Emergency Medicine

## 2012-09-01 ENCOUNTER — Encounter (HOSPITAL_COMMUNITY): Payer: Self-pay | Admitting: Emergency Medicine

## 2012-09-01 DIAGNOSIS — S63509A Unspecified sprain of unspecified wrist, initial encounter: Secondary | ICD-10-CM

## 2012-09-01 DIAGNOSIS — S63501A Unspecified sprain of right wrist, initial encounter: Secondary | ICD-10-CM

## 2012-09-01 MED ORDER — IBUPROFEN 800 MG PO TABS: 800.0000 mg | ORAL_TABLET | Freq: Three times a day (TID) | ORAL | Status: AC | PRN

## 2012-09-01 NOTE — ED Provider Notes (Signed)
History     CSN: [id number]  Arrival date & time 09/01/12  1111   First MD Initiated Contact with Patient 09/01/12 1151      Chief Complaint  Patient presents with  . Hand Pain    (Consider location/radiation/quality/duration/timing/severity/associated sxs/prior treatment) HPI Comments: Reports 2 days ago was walking up wet steps, foot slipped, extended right arm/hand to catch self.  Patient reports bending right hand back.  Patient has been icing tender area.  Today woke with finger tips tingling, and numbness in index finger and thumb all the way to the wrist.  Able to move fingers, but feels tight.  She rubbed her right wrist and an Ace wrap been taking Motrin applying ice. He expresses shooting pains that go from her wrist to her forearm area.  Patient is a 26 y.o. female presenting with hand pain. The history is provided by the patient.  Hand Pain This is a new problem. The current episode started 12 to 24 hours ago. The problem occurs constantly. The problem has not changed since onset.Pertinent negatives include no headaches. The symptoms are aggravated by bending. Nothing relieves the symptoms. The treatment provided no relief.    Past Medical History  Diagnosis Date  . Heart murmur   . Diabetes mellitus     Past Surgical History  Procedure Date  . Dental surgery     No family history on file.  History  Substance Use Topics  . Smoking status: Current Every Day Smoker    Types: Cigarettes  . Smokeless tobacco: Not on file  . Alcohol Use: No    OB History    Grav Para Term Preterm Abortions TAB SAB Ect Mult Living                  Review of Systems  Constitutional: Negative for fever, chills, activity change and appetite change.  Musculoskeletal: Negative for myalgias and joint swelling.  Skin: Negative for rash.  Neurological: Positive for numbness. Negative for syncope, facial asymmetry, weakness and headaches.    Allergies  Onion; Almond oil; and Soy  allergy  Home Medications   Current Outpatient Rx  Name Route Sig Dispense Refill  . AMOXICILLIN 500 MG PO CAPS Oral Take 500 mg by mouth 3 (three) times daily.    Marland Kitchen DICLOFENAC SODIUM 75 MG PO TBEC Oral Take 1 tablet (75 mg total) by mouth 2 (two) times daily. 20 tablet 0  . IBUPROFEN 800 MG PO TABS Oral Take 800 mg by mouth every 8 (eight) hours as needed. For pain.    . IBUPROFEN 800 MG PO TABS Oral Take 1 tablet (800 mg total) by mouth every 8 (eight) hours as needed for pain. 30 tablet 0  . KETOCONAZOLE 2 % EX CREA Topical Apply topically daily. 15 g 0    BP 128/73  Pulse 79  Temp 99 F (37.2 C) (Oral)  Resp 18  SpO2 100%  LMP 07/21/2012  Physical Exam  Nursing note and vitals reviewed. Constitutional: She appears well-developed and well-nourished.  Musculoskeletal: She exhibits tenderness.       Arms: Neurological: She is alert.  Skin: No rash noted. No erythema.    ED Course  Procedures (including critical care time)  Labs Reviewed - No data to display Dg Wrist Complete Right  09/01/2012  *RADIOLOGY REPORT*  Clinical Data: Pain post trauma  RIGHT WRIST - COMPLETE 3+ VIEW  Comparison: None.  Findings:  Frontal, oblique, lateral, and ulnar deviation scaphoid images were obtained.  No fracture or dislocation.  Joint spaces appear intact.  No erosive change.  IMPRESSION: No abnormality noted.   Original Report Authenticated By: Arvin Collard. WOODRUFF III, M.D.      1. Sprain of right wrist       MDM   Contusion/sprain strain of her right wrist. Patient was provided with a Velcro wrist splint encouraged to followup with an orthopedic doctor pain was to persist beyond 7-10 days for re\re check. Patient agrees to followup as necessary Will keep her hand elevated as much as possible. Will take NSAIDs       Jimmie Molly, MD 09/01/12 1714

## 2012-09-01 NOTE — ED Notes (Signed)
Reports 2 days ago was walking up wet steps, foot slipped, extended right arm/hand to catch self.  Patient reports bending right hand back.  Patient has been icing tender area.  Today woke with finger tips tingling, and numbness in index finger and thumb all the way to the wrist.  Able to move fingers, but feels tight.  Radial pulses 2 plus.

## 2013-02-28 ENCOUNTER — Emergency Department: Payer: Self-pay | Admitting: Emergency Medicine

## 2013-02-28 LAB — COMPREHENSIVE METABOLIC PANEL WITH GFR
Albumin: 4.3 g/dL
Alkaline Phosphatase: 68 U/L
Anion Gap: 2 — ABNORMAL LOW
BUN: 10 mg/dL
Bilirubin,Total: 0.2 mg/dL
Calcium, Total: 9 mg/dL
Chloride: 105 mmol/L
Co2: 30 mmol/L
Creatinine: 0.64 mg/dL
EGFR (African American): >60
EGFR (Non-African Amer.): >60
Glucose: 101 mg/dL — ABNORMAL HIGH
Osmolality: 273
Potassium: 4 mmol/L
SGOT(AST): 16 U/L
SGPT (ALT): 21 U/L
Sodium: 137 mmol/L
Total Protein: 7.6 g/dL

## 2013-02-28 LAB — CBC
HCT: 40 %
HGB: 13.7 g/dL
MCH: 30.7 pg
MCHC: 34.3 g/dL
MCV: 90 fL
Platelet: 331 10*3/uL
RBC: 4.47 X10 6/mm 3
RDW: 13.3 %
WBC: 9.9 10*3/uL

## 2013-02-28 LAB — URINALYSIS, COMPLETE
Bacteria: NONE SEEN
Bilirubin,UR: NEGATIVE
Blood: NEGATIVE
Glucose,UR: NEGATIVE mg/dL
Ketone: NEGATIVE
Leukocyte Esterase: NEGATIVE
Nitrite: NEGATIVE
Ph: 7
Protein: NEGATIVE
RBC,UR: 1 /HPF
Specific Gravity: 1.01
Squamous Epithelial: 11
WBC UR: 1 /HPF

## 2013-02-28 LAB — LIPASE, BLOOD: Lipase: 68 U/L — ABNORMAL LOW (ref 73–393)

## 2013-03-02 LAB — URINE CULTURE

## 2013-08-17 ENCOUNTER — Emergency Department (HOSPITAL_COMMUNITY): Payer: Medicaid Other

## 2013-08-17 ENCOUNTER — Emergency Department (HOSPITAL_COMMUNITY)
Admission: EM | Admit: 2013-08-17 | Discharge: 2013-08-17 | Disposition: A | Discharge status: Home / Self Care | Payer: Self-pay | Attending: Emergency Medicine | Admitting: Emergency Medicine

## 2013-08-17 ENCOUNTER — Encounter (HOSPITAL_COMMUNITY): Payer: Self-pay | Admitting: *Deleted

## 2013-08-17 DIAGNOSIS — R209 Unspecified disturbances of skin sensation: Secondary | ICD-10-CM | POA: Insufficient documentation

## 2013-08-17 DIAGNOSIS — M549 Dorsalgia, unspecified: Secondary | ICD-10-CM

## 2013-08-17 DIAGNOSIS — S39012A Strain of muscle, fascia and tendon of lower back, initial encounter: Secondary | ICD-10-CM

## 2013-08-17 DIAGNOSIS — R011 Cardiac murmur, unspecified: Secondary | ICD-10-CM | POA: Insufficient documentation

## 2013-08-17 DIAGNOSIS — Z91040 Latex allergy status: Secondary | ICD-10-CM | POA: Insufficient documentation

## 2013-08-17 DIAGNOSIS — E119 Type 2 diabetes mellitus without complications: Secondary | ICD-10-CM | POA: Insufficient documentation

## 2013-08-17 DIAGNOSIS — R42 Dizziness and giddiness: Secondary | ICD-10-CM | POA: Insufficient documentation

## 2013-08-17 DIAGNOSIS — Y929 Unspecified place or not applicable: Secondary | ICD-10-CM | POA: Insufficient documentation

## 2013-08-17 DIAGNOSIS — X500XXA Overexertion from strenuous movement or load, initial encounter: Secondary | ICD-10-CM | POA: Insufficient documentation

## 2013-08-17 DIAGNOSIS — Y9389 Activity, other specified: Secondary | ICD-10-CM | POA: Insufficient documentation

## 2013-08-17 DIAGNOSIS — F17200 Nicotine dependence, unspecified, uncomplicated: Secondary | ICD-10-CM | POA: Insufficient documentation

## 2013-08-17 DIAGNOSIS — S335XXA Sprain of ligaments of lumbar spine, initial encounter: Secondary | ICD-10-CM | POA: Insufficient documentation

## 2013-08-17 MED ORDER — HYDROCODONE-ACETAMINOPHEN 5-325 MG PO TABS
1.0000 | ORAL_TABLET | Freq: Once | ORAL | Status: AC
  Administered 2013-08-17: 1 via ORAL
  Filled 2013-08-17: qty 1

## 2013-08-17 MED ORDER — DIAZEPAM 5 MG PO TABS: 5.0000 mg | ORAL_TABLET | Freq: Two times a day (BID) | ORAL | Status: DC

## 2013-08-17 MED ORDER — DIAZEPAM 5 MG PO TABS
5.0000 mg | ORAL_TABLET | Freq: Once | ORAL | Status: AC
Start: 2013-08-17 — End: 2013-08-17
  Administered 2013-08-17: 5 mg via ORAL
  Filled 2013-08-17: qty 1

## 2013-08-17 MED ORDER — ONDANSETRON 4 MG PO TBDP
4.0000 mg | ORAL_TABLET | Freq: Once | ORAL | Status: AC
  Administered 2013-08-17: 4 mg via ORAL
  Filled 2013-08-17: qty 1

## 2013-08-17 MED ORDER — HYDROCODONE-ACETAMINOPHEN 5-325 MG PO TABS: 1.0000 | ORAL_TABLET | Freq: Four times a day (QID) | ORAL | Status: DC | PRN

## 2013-08-17 NOTE — ED Notes (Signed)
Reports bending over to pick something up around 1400 today and had onset of lower back pain that radiates to both legs.

## 2013-08-17 NOTE — ED Provider Notes (Signed)
Medical screening examination/treatment/procedure(s) were performed by non-physician practitioner and as supervising physician I was immediately available for consultation/collaboration.  Ladrea Holladay, MD 08/17/13 2229 

## 2013-08-17 NOTE — ED Notes (Signed)
Pt states she was sitting down on couch and bent over to pick up a tote when she lifted it she heard a loud "pop" in her left lower back- pain radiated from left side to right side then down bilateral lower extremities.  Admits to difficulty ambulating and dizziness since incident.  Pt has urinated since incident- denies any problems with urination.

## 2013-08-17 NOTE — ED Provider Notes (Signed)
CSN: [id number]     Arrival date & time 08/17/13  1657 History  This chart was scribed for non-physician practitioner, Johnnette Gourd, PA, working with Donnetta Hutching, MD, by [facility] ED Scribe. This patient was seen in room TR05C/TR05C and the patient's care was started at 6:36 PM.  Chief Complaint  Patient presents with  . Back Pain   The history is provided by the patient. No language interpreter was used.    HPI Comments: Sabrina Griffin is a 27 y.o. female who presents to the Emergency Department complaining of constant, moderate left lower back pain onset suddenly after lifting an object earlier today. She states that she felt a sudden sharp pain in her back. and that she has had intermittent bilateral leg numbness. She also states that she has experienced some dizziness and light-headedness after the injury. She states that her pain is worsened with any movement and severely worsened with walking. She states that she has applied ice and a heating pad without relief. She denies bowel or bladder incontinence, saddle anaesthesia or any other symptoms.  Past Medical History  Diagnosis Date  . Heart murmur   . Diabetes mellitus    Past Surgical History  Procedure Laterality Date  . Dental surgery     History reviewed. No pertinent family history. History  Substance Use Topics  . Smoking status: Current Every Day Smoker    Types: Cigarettes  . Smokeless tobacco: Not on file  . Alcohol Use: No   OB History   Grav Para Term Preterm Abortions TAB SAB Ect Mult Living                 Review of Systems  Constitutional: Negative for fever and chills.  Gastrointestinal: Negative for constipation.       Denies bowel incontinence.  Genitourinary: Negative for dysuria.       Denies bladder incontinence.  Musculoskeletal: Positive for back pain.  Neurological: Positive for numbness.  All other systems reviewed and are negative.   Allergies  Onion; Other; Almond oil; Soy allergy;  and Latex  Home Medications  No current outpatient prescriptions on file.  Triage Vitals: BP 96/51  Pulse 91  Temp(Src) 97.8 F (36.6 C) (Oral)  Resp 16  SpO2 98%  LMP 08/08/2013  Physical Exam  Nursing note and vitals reviewed. Constitutional: She is oriented to person, place, and time. She appears well-developed and well-nourished. No distress.  HENT:  Head: Normocephalic and atraumatic.  Mouth/Throat: Oropharynx is clear and moist.  Eyes: Conjunctivae and EOM are normal.  Neck: Normal range of motion. Neck supple.  Cardiovascular: Normal rate, regular rhythm and normal heart sounds.   Pulmonary/Chest: Effort normal and breath sounds normal. No respiratory distress.  Musculoskeletal: Normal range of motion. She exhibits no edema.  Tenderness across lower back. No muscle spasms, edema or deformity.   Neurological: She is alert and oriented to person, place, and time. No sensory deficit.  Strength 5/5 equal bilaterally. Sensation intact.  Skin: Skin is warm and dry.  Psychiatric: She has a normal mood and affect. Her behavior is normal.    ED Course  Procedures (including critical care time)  DIAGNOSTIC STUDIES: Oxygen Saturation is 98% on RA, normal by my interpretation.    COORDINATION OF CARE: 6:43 PM- Pt advised of plan to receive Norco and Valium in the ED. Pt also advised of plan to have an x-ray of her L-spine. Pt agrees with plan for treatment.  Medications  HYDROcodone-acetaminophen (NORCO/VICODIN) 5-325  MG per tablet 1 tablet (not administered)  diazepam (VALIUM) tablet 5 mg (not administered)   Labs Review Labs Reviewed - No data to display Imaging Review Dg Lumbar Spine Complete  08/17/2013   CLINICAL DATA:  Lower back pain.  EXAM: LUMBAR SPINE - COMPLETE 4+ VIEW  COMPARISON:  No priors.  FINDINGS: Five views of the lumbar spine demonstrate no acute displaced fracture or compression type fracture. Alignment is anatomic. No defects of the pars  interarticularis. No significant degenerative changes.  IMPRESSION: No acute radiographic abnormality of the lumbar spine.   Electronically Signed   By: Trudie Reed M.D.   On: 08/17/2013 19:52    MDM   1. Back pain   2. Lumbar strain, initial encounter    Patient with low-back pain after bending forward. Generalized tenderness to lower back. X-rays obtained without any acute abnormality. No red flags concerning patient's back pain. No s/s of central cord compression or cauda equina. Lower extremities are neurovascularly intact and patient is ambulating slowly but normal. She will be discharged home with 8 Vicodin, Valium. Conservative measures discussed. Return precautions discussed. Patient states understanding of plan and is agreeable.    I personally performed the services described in this documentation, which was scribed in my presence. The recorded information has been reviewed and is accurate.      Trevor Mace, PA-C 08/17/13 2010

## 2013-08-17 NOTE — ED Notes (Signed)
Pt c/o mild nausea, is concerned medication will upset her stomach. EDPA aware

## 2014-01-11 ENCOUNTER — Encounter (HOSPITAL_COMMUNITY): Payer: Self-pay | Admitting: Emergency Medicine

## 2014-01-11 ENCOUNTER — Emergency Department (HOSPITAL_COMMUNITY)
Admission: EM | Admit: 2014-01-11 | Discharge: 2014-01-11 | Disposition: A | Discharge status: Home / Self Care | Payer: Medicaid Other | Attending: Emergency Medicine | Admitting: Emergency Medicine

## 2014-01-11 DIAGNOSIS — H538 Other visual disturbances: Secondary | ICD-10-CM | POA: Insufficient documentation

## 2014-01-11 DIAGNOSIS — H9209 Otalgia, unspecified ear: Secondary | ICD-10-CM | POA: Insufficient documentation

## 2014-01-11 DIAGNOSIS — Z91040 Latex allergy status: Secondary | ICD-10-CM | POA: Insufficient documentation

## 2014-01-11 DIAGNOSIS — IMO0002 Reserved for concepts with insufficient information to code with codable children: Secondary | ICD-10-CM | POA: Insufficient documentation

## 2014-01-11 DIAGNOSIS — J019 Acute sinusitis, unspecified: Secondary | ICD-10-CM | POA: Insufficient documentation

## 2014-01-11 DIAGNOSIS — E119 Type 2 diabetes mellitus without complications: Secondary | ICD-10-CM | POA: Insufficient documentation

## 2014-01-11 DIAGNOSIS — F17200 Nicotine dependence, unspecified, uncomplicated: Secondary | ICD-10-CM | POA: Insufficient documentation

## 2014-01-11 DIAGNOSIS — Z79899 Other long term (current) drug therapy: Secondary | ICD-10-CM | POA: Insufficient documentation

## 2014-01-11 DIAGNOSIS — R011 Cardiac murmur, unspecified: Secondary | ICD-10-CM | POA: Insufficient documentation

## 2014-01-11 DIAGNOSIS — J029 Acute pharyngitis, unspecified: Secondary | ICD-10-CM | POA: Insufficient documentation

## 2014-01-11 LAB — RAPID STREP SCREEN (MED CTR MEBANE ONLY): STREPTOCOCCUS, GROUP A SCREEN (DIRECT): NEGATIVE

## 2014-01-11 MED ORDER — OXYMETAZOLINE HCL 0.05 % NA SOLN: 1.0000 | Freq: Two times a day (BID) | NASAL | Status: DC

## 2014-01-11 MED ORDER — FLUTICASONE PROPIONATE 50 MCG/ACT NA SUSP: 2.0000 | Freq: Every day | NASAL | Status: DC

## 2014-01-11 NOTE — ED Notes (Signed)
Pt c/o ear pressure, nasal congestion, throat itchiness, and headache x 2 days.  States she thinks she has a sinus infection.

## 2014-01-11 NOTE — Progress Notes (Signed)
[facility] provided pt with a list of primary care resources and [facility] information to help patient establish primary care.  °

## 2014-01-11 NOTE — ED Provider Notes (Signed)
CSN: [id number]     Arrival date & time 01/11/14  1023 History  This chart was scribed for non-physician practitioner, Mellody Drown, PA-C working with Toy Baker, MD by Greggory Stallion, ED scribe. This patient was seen in room WTR5/WTR5 and the patient's care was started at 12:53 PM.   Chief Complaint  Patient presents with  . Otalgia  . Sore Throat  . Headache   The history is provided by the patient. No language interpreter was used.   HPI Comments: TOPEKA GIAMMONA is a 28 y.o. female who presents to the Emergency Department complaining of rhinorrhea and congestion that started 4 days ago. She states she also has bilateral sharp ear pain, blurry vision, diffuse headache and sinus pressure that started yesterday. Pt had a sore throat but it was resolved with honey. She has a cough due to postnasal drip. States symptoms feel similar to a past sinus infection. She has taken mucinex and benadryl with no relief. Denies fever, chills.   Past Medical History  Diagnosis Date  . Heart murmur   . Diabetes mellitus    Past Surgical History  Procedure Laterality Date  . Dental surgery     No family history on file. History  Substance Use Topics  . Smoking status: Current Every Day Smoker    Types: Cigarettes  . Smokeless tobacco: Not on file  . Alcohol Use: No   OB History   Grav Para Term Preterm Abortions TAB SAB Ect Mult Living                 Review of Systems  Constitutional: Negative for fever and chills.  HENT: Positive for congestion, ear pain, postnasal drip and sinus pressure. Negative for sore throat.   Eyes: Positive for visual disturbance.  Respiratory: Positive for cough.   Neurological: Positive for headaches.  All other systems reviewed and are negative.   Allergies  Onion; Other; Almond oil; Soy allergy; and Latex  Home Medications   Current Outpatient Rx  Name  Route  Sig  Dispense  Refill  . fluticasone (FLONASE) 50 MCG/ACT nasal spray   Each Nare    Place 2 sprays into both nostrils daily.   16 g   0   . oxymetazoline (AFRIN NASAL SPRAY) 0.05 % nasal spray   Each Nare   Place 1 spray into both nostrils 2 (two) times daily.   30 mL   0     BP 132/71  Pulse 100  Temp(Src) 98.8 F (37.1 C) (Oral)  Resp 16  SpO2 100%  Physical Exam  Nursing note and vitals reviewed. Constitutional: She is oriented to person, place, and time. She appears well-developed and well-nourished. No distress.  HENT:  Head: Normocephalic and atraumatic.  Right Ear: Tympanic membrane and ear canal normal.  Left Ear: Tympanic membrane and ear canal normal.  Nose: Mucosal edema and rhinorrhea present. Right sinus exhibits maxillary sinus tenderness and frontal sinus tenderness. Left sinus exhibits maxillary sinus tenderness and frontal sinus tenderness.  Mouth/Throat: Uvula is midline and mucous membranes are normal. No oral lesions. No trismus in the jaw. No uvula swelling. Posterior oropharyngeal erythema present. No oropharyngeal exudate, posterior oropharyngeal edema or tonsillar abscesses.  Eyes: Conjunctivae and EOM are normal. Right eye exhibits no discharge. Left eye exhibits no discharge.  Neck: Neck supple. No tracheal deviation present.  Cardiovascular: Normal rate, regular rhythm and normal heart sounds.   Not tachycardic on exam  Pulmonary/Chest: Effort normal and breath sounds normal.  No respiratory distress. She has no wheezes. She has no rales.  Abdominal: Soft.  Musculoskeletal: Normal range of motion.  Lymphadenopathy:       Head (right side): No submental, no submandibular, no tonsillar, no preauricular, no posterior auricular and no occipital adenopathy present.       Head (left side): No submental, no submandibular, no tonsillar, no preauricular, no posterior auricular and no occipital adenopathy present.    She has no cervical adenopathy.  Neurological: She is alert and oriented to person, place, and time.  Skin: Skin is warm and  dry.  Psychiatric: She has a normal mood and affect. Her behavior is normal.    ED Course  Procedures (including critical care time)  COORDINATION OF CARE: 12:58 PM-Discussed treatment plan which includes an antibiotic and two nasal sprays with pt at bedside and pt agreed to plan.   Labs Review Labs Reviewed  RAPID STREP SCREEN   Imaging Review No results found.  EKG Interpretation   None       MDM   Final diagnoses:  Acute sinus infection   Pt with a 4 day history of congestion. Afebrile.  PE shows sinus tenderness. Likely viral, will treat as out-pt. Discussed treatment plan with the patient. Return precautions given. Reports understanding and no other concerns at this time.  Patient is stable for discharge at this time.  Meds given in ED:  Medications - No data to display  New Prescriptions   FLUTICASONE (FLONASE) 50 MCG/ACT NASAL SPRAY    Place 2 sprays into both nostrils daily.   OXYMETAZOLINE (AFRIN NASAL SPRAY) 0.05 % NASAL SPRAY    Place 1 spray into both nostrils 2 (two) times daily.    I personally performed the services described in this documentation, which was scribed in my presence. The recorded information has been reviewed and is accurate.  Clabe SealLauren M Yuliana Vandrunen, PA-C 01/13/14 1144

## 2014-01-11 NOTE — Discharge Instructions (Signed)
Call for a follow up appointment with a Family or Primary Care Provider.  Return if Symptoms worsen.   Take medication as prescribed.  You can continue to take Mucinex for further relief of your nasal congestion.   Emergency Department Resource Guide 1) Find a Doctor and Pay Out of Pocket Although you won't have to find out who is covered by your insurance plan, it is a good idea to ask around and get recommendations. You will then need to call the office and see if the doctor you have chosen will accept you as a new patient and what types of options they offer for patients who are self-pay. Some doctors offer discounts or will set up payment plans for their patients who do not have insurance, but you will need to ask so you aren't surprised when you get to your appointment.  2) Contact Your Local Health Department Not all health departments have doctors that can see patients for sick visits, but many do, so it is worth a call to see if yours does. If you don't know where your local health department is, you can check in your phone book. The CDC also has a tool to help you locate your state's health department, and many state websites also have listings of all of their local health departments.  3) Find a Walk-in Clinic If your illness is not likely to be very severe or complicated, you may want to try a walk in clinic. These are popping up all over the country in pharmacies, drugstores, and shopping centers. They're usually staffed by nurse practitioners or physician assistants that have been trained to treat common illnesses and complaints. They're usually fairly quick and inexpensive. However, if you have serious medical issues or chronic medical problems, these are probably not your best option.  No Primary Care Doctor: - Call Health Connect at  [phone] - they can help you locate a primary care doctor that  accepts your insurance, provides certain services, etc. - Physician Referral Service-  [phone]  Chronic Pain Problems: Organization         Address  Phone   Notes  [facility]  [phone] Patients need to be referred by their primary care doctor.   Medication Assistance: Organization         Address  Phone   Notes  [facility] Medication [facility] [address] [place], [place] [address] [phone] --Must be a resident of [place] [facility] [place] -- Must have NO insurance coverage whatsoever (no Medicaid/ Medicare, etc.) -- The pt. MUST have a primary care doctor that directs their care regularly and follows them in the community   MedAssist  [phone]) [phone]   [employer]  [phone]) [phone]    Agencies that provide inexpensive medical care: Organization         Address  Phone   Notes  [facility]  [phone]) [phone]   [facility]    [phone]   [facility] [address] [place], [place] [address] [phone]   [facility] [address], [place] [phone]   Planned Parenthood    [phone]   [facility]    [phone]   [facility]  [address], [place] Phone:  [phone], Fax:  [phone] Hours of Operation:  9 am - 6 pm, M-F.  Also accepts Medicaid/Medicare and self-pay.  [facility] for [place] [address], [address], [place] Phone: [phone], Fax: [phone]. Hours of Operation:  8:30 am - 5:30 pm, M-F.  Also accepts Medicaid and self-pay.  [facility] [place] [address] [place] Phone: [phone]   [place], [place], [place] [phone], Ext. [phone] Mondays & Thursdays: 7-9 AM.  First 15 patients are seen on a first come, first serve basis.    [place] Providers:  Organization         Address  Phone   Notes  [facility] [address],  [place] [phone] Also accepts self-pay patients.  [facility] [id number] [place], [place]  [phone]   [place], [address], [place] [phone]   [facility] [address] [place] [phone]   Lucianne Lei [address], [place]   [phone] Only accepts [place] Access [place] patients after they have their name applied to their card.   Self-Pay (no insurance) in [facility]:  Organization         Address  Phone   Notes  Sickle Cell Patients, [facility] [place] [phone]   [facility] [place] [phone]   [facility] [place]  [place], [place], [place] [phone]   [facility]/Dr. Osei-Bonsu  [address] [place] [address] [place] or [address], [place] [phone] Phone number for both [place] and [place] locations is the same.  Urgent Medical and [facility] [address], [place] [phone]   [facility] [address] [place] or [address] [phone]   [facility] [address], [place] [phone], phone; [phone], fax Sees patients 1st and 3rd Saturday of every month.  Must not qualify for public or private insurance (i.e. Medicaid, Medicare, [place] Health Choice, Veterans' Benefits)  Household income should be no more than 200% of the poverty level The clinic cannot treat you if you are pregnant or think you are pregnant  Sexually transmitted diseases are not treated at the clinic.    Dental Care: Organization         Address  Phone  Notes  [facility] of [facility] [place] [phone] Accepts children up to age 81 who are enrolled in  [place] or [place]; pregnant women with a Medicaid card; and children who have applied for Medicaid or  Health Choice, but were declined, whose parents can pay a reduced fee at time of service.  [facility] of [facility]  [address], [place] [phone] Accepts children up to age 44 who are enrolled in [place] or [place]; pregnant women with a Medicaid card; and children who have applied for Medicaid or  Health Choice, but were declined, whose parents can pay a reduced fee at time of service.  [facility]  [place] [phone] Patients are seen by appointment only. Walk-ins are not accepted. [place] will see patients 52 years of age and older. Monday - Tuesday (8am-5pm) Most Wednesdays (8:30-5pm) $30 per  visit, cash only  [facility] Adult [employer] PROGRAM  [address], [address] [phone] Patients are seen by appointment only. Walk-ins are not accepted. [place] will see patients 48 years of age and older. One Wednesday Evening (Monthly: Volunteer Based).  $30 per visit, cash only  [place]  [phone] for adults; Children under age 62, call Graduate Pediatric Dentistry at [phone]. Children aged 41-14, please call [phone] to request a pediatric application.  Dental services are provided in all areas of dental care including fillings, crowns and bridges, complete and partial dentures, implants, gum treatment, root canals, and extractions. Preventive care is also provided. Treatment is provided to both adults and children. Patients are selected via a lottery and there is often a waiting list.   [facility] [address] [place]  [phone] [email or web address]   [facility] [address] [place], [place] [phone], Ext. [phone] Second and Fourth Thursday of each month, opens at 6:30  AM; Clinic ends at 9 AM.  Patients are seen on a first-come first-served basis, and a limited number are seen during each clinic.   [facility]  [address] [place] Danker [place], [place] [phone]   Eligibility Requirements You must have lived in [place], [place], or [place] counties for at least the last three months.   You cannot be eligible for state or federal sponsored [employer], including [employer], [place], or [employer].   You generally cannot be eligible for healthcare insurance through your employer.    How to apply: Eligibility screenings are held every Tuesday and Wednesday afternoon from 1:00 pm until 4:00 pm. You do not need an appointment for the interview!  [facility] [address], [place], [place]   [place]  [place] Department  [place]  [phone]    Behavioral Health Resources in the Community: Intensive Outpatient Programs Organization         Address  Phone  Notes  Ecru Dyer. [address] [place], [place] [phone]   [facility] Outpatient [address] [place], [place]   ADS: Alcohol & Drug Svcs [address] [place], [place]   [place] [address]  [place], [place] or [phone]   Substance Abuse Resources Organization         Address  Phone  Notes  Alcohol and Drug Services  [phone]   [place]  [phone]   The [place]   Chinita Pester  [phone]   Residential & Outpatient Substance Abuse Program  [phone]   Psychological Services Organization         Address  Phone  Notes  [facility] [place]  [place]  [phone]   [place] [address] [place] or  [phone]    Mobile Crisis Teams Organization         Address  Phone  Notes  [facility], Mobile Crisis Care Unit  [phone]   [facility]  [address] [place], [place]   [place] [address], [place] [phone]    Self-Help/Support Groups Organization         Address  Phone  Notes  [facility]ental [facility] Assoc[facility] of [place] - variety of support groups  [place] Call for more information  Narcotics Anonymous (NA), [facility] [address], [employer] [place]  2 meetings at this location   Special educational needs teacher         Address  Phone  Notes  [facility] Residential Treatment [place],    [place]  [phone]   [facility]  [address] [place] [id number], [place], [place]   [place], [place] [phone] Admissions: 8am-3pm M-F  Incentives Substance [place] [address]    [place], [place] [phone]   [facility] [address] [place], [place], [place]   [facility] [address],  [place], [place]   [facility] - Intensive Outpatient [address] Kristeen [address], [place], [place]   [facility] ([place] [address].,  [place], [place] [phone] or [phone]   [facility]idential [facility] Services (RTS) [address] [place], [place] Accepts Medicaid  [facility] [place] [address],  [place] [phone] Substance Abuse/Addiction Treatment   [facility] Organization         Address  Phone  Notes  [facility]  [phone]   Domenic Schwab, PhD [address] Arlis [place], [place]   [phone] or [phone]   [place] and [place], [place] [phone]   [facility] [address],  [place], [place] [phone] Insurance/Medicaid/sponsorship through [facility] and Families [address], [address] [place]                                    [place], [place] [phone] [place] [address] [place], [place] [phone]    Dr. Adele Schilder  [phone]   [facility] Dept. 1) [address], [place] 2) [place] 3)  [place] [address], [place] [phone]  [phone]   [place] [phone] or [phone] (After Hours)

## 2014-01-13 LAB — CULTURE, GROUP A STREP

## 2014-01-14 NOTE — ED Provider Notes (Signed)
Medical screening examination/treatment/procedure(s) were performed by non-physician practitioner and as supervising physician I was immediately available for consultation/collaboration.  Deanthony Maull T Phiona Ramnauth, MD 01/14/14 2013 

## 2014-11-23 ENCOUNTER — Encounter (HOSPITAL_COMMUNITY): Payer: Self-pay | Admitting: Emergency Medicine

## 2014-11-23 ENCOUNTER — Emergency Department (HOSPITAL_COMMUNITY)
Admission: EM | Admit: 2014-11-23 | Discharge: 2014-11-23 | Disposition: A | Discharge status: Home / Self Care | Payer: Medicaid Other | Source: Home / Self Care | Attending: Emergency Medicine | Admitting: Emergency Medicine

## 2014-11-23 ENCOUNTER — Ambulatory Visit (HOSPITAL_COMMUNITY): Payer: Medicaid Other | Attending: Emergency Medicine

## 2014-11-23 DIAGNOSIS — Y92099 Unspecified place in other non-institutional residence as the place of occurrence of the external cause: Secondary | ICD-10-CM

## 2014-11-23 DIAGNOSIS — M25552 Pain in left hip: Secondary | ICD-10-CM | POA: Insufficient documentation

## 2014-11-23 DIAGNOSIS — S7012XA Contusion of left thigh, initial encounter: Secondary | ICD-10-CM

## 2014-11-23 DIAGNOSIS — T1490 Injury, unspecified: Secondary | ICD-10-CM

## 2014-11-23 DIAGNOSIS — W19XXXA Unspecified fall, initial encounter: Secondary | ICD-10-CM

## 2014-11-23 DIAGNOSIS — W010XXA Fall on same level from slipping, tripping and stumbling without subsequent striking against object, initial encounter: Secondary | ICD-10-CM | POA: Diagnosis not present

## 2014-11-23 DIAGNOSIS — M25559 Pain in unspecified hip: Secondary | ICD-10-CM

## 2014-11-23 DIAGNOSIS — S7002XA Contusion of left hip, initial encounter: Secondary | ICD-10-CM | POA: Diagnosis not present

## 2014-11-23 DIAGNOSIS — Y92009 Unspecified place in unspecified non-institutional (private) residence as the place of occurrence of the external cause: Secondary | ICD-10-CM

## 2014-11-23 DIAGNOSIS — T148XXA Other injury of unspecified body region, initial encounter: Secondary | ICD-10-CM

## 2014-11-23 MED ORDER — KETOROLAC TROMETHAMINE 10 MG PO TABS: 10.0000 mg | ORAL_TABLET | Freq: Four times a day (QID) | ORAL | Status: DC | PRN

## 2014-11-23 MED ORDER — TRAMADOL HCL 50 MG PO TABS: 50.0000 mg | ORAL_TABLET | Freq: Four times a day (QID) | ORAL | Status: DC | PRN

## 2014-11-23 NOTE — Discharge Instructions (Signed)
Contusion A contusion is a deep bruise. Contusions are the result of an injury that caused bleeding under the skin. The contusion may turn blue, purple, or yellow. Minor injuries will give you a painless contusion, but more severe contusions may stay painful and swollen for a few weeks.  CAUSES  A contusion is usually caused by a blow, trauma, or direct force to an area of the body. SYMPTOMS   Swelling and redness of the injured area.  Bruising of the injured area.  Tenderness and soreness of the injured area.  Pain. DIAGNOSIS  The diagnosis can be made by taking a history and physical exam. An X-ray, CT scan, or MRI may be needed to determine if there were any associated injuries, such as fractures. TREATMENT  Specific treatment will depend on what area of the body was injured. In general, the best treatment for a contusion is resting, icing, elevating, and applying cold compresses to the injured area. Over-the-counter medicines may also be recommended for pain control. Ask your caregiver what the best treatment is for your contusion. HOME CARE INSTRUCTIONS   Put ice on the injured area.  Put ice in a plastic bag.  Place a towel between your skin and the bag.  Leave the ice on for 15-20 minutes, 3-4 times a day, or as directed by your health care provider.  Only take over-the-counter or prescription medicines for pain, discomfort, or fever as directed by your caregiver. Your caregiver may recommend avoiding anti-inflammatory medicines (aspirin, ibuprofen, and naproxen) for 48 hours because these medicines may increase bruising.  Rest the injured area.  If possible, elevate the injured area to reduce swelling. SEEK IMMEDIATE MEDICAL CARE IF:   You have increased bruising or swelling.  You have pain that is getting worse.  Your swelling or pain is not relieved with medicines. MAKE SURE YOU:   Understand these instructions.  Will watch your condition.  Will get help right  away if you are not doing well or get worse. Document Released: 08/28/2005 Document Revised: 11/23/2013 Document Reviewed: 09/23/2011 [facility] Patient Information 2015 [place], [place]. This information is not intended to replace advice given to you by your health care provider. Make sure you discuss any questions you have with your health care provider.  Hip Pain Your hip is the joint between your upper legs and your lower pelvis. The bones, cartilage, tendons, and muscles of your hip joint perform a lot of work each day supporting your body weight and allowing you to move around. Hip pain can range from a minor ache to severe pain in one or both of your hips. Pain may be felt on the inside of the hip joint near the groin, or the outside near the buttocks and upper thigh. You may have swelling or stiffness as well.  HOME CARE INSTRUCTIONS   Take medicines only as directed by your health care provider.  Apply ice to the injured area:  Put ice in a plastic bag.  Place a towel between your skin and the bag.  Leave the ice on for 15-20 minutes at a time, 3-4 times a day.  Keep your leg raised (elevated) when possible to lessen swelling.  Avoid activities that cause pain.  Follow specific exercises as directed by your health care provider.  Sleep with a pillow between your legs on your most comfortable side.  Record how often you have hip pain, the location of the pain, and what it feels like. SEEK MEDICAL CARE IF:  You are unable to put weight on your leg.  Your hip is red or swollen or very tender to touch.  Your pain or swelling continues or worsens after 1 week.  You have increasing difficulty walking.  You have a fever. SEEK IMMEDIATE MEDICAL CARE IF:   You have fallen.  You have a sudden increase in pain and swelling in your hip. MAKE SURE YOU:   Understand these instructions.  Will watch your condition.  Will get help right away if you are not doing well or get  worse. Document Released: 05/08/2010 Document Revised: 04/04/2014 Document Reviewed: 07/15/2013 [facility] Patient Information 2015 [place]ExitCare, [place]LL[place]. This information is not intended to replace advice given to you by your health care provider. Make sure you discuss any questions you have with your health care provider.  Contusion A contusion is a deep bruise. Contusions happen when an injury causes bleeding under the skin. Signs of bruising include pain, puffiness (swelling), and discolored skin. The contusion may turn blue, purple, or yellow. HOME CARE   Put ice on the injured area.  Put ice in a plastic bag.  Place a towel between your skin and the bag.  Leave the ice on for 15-20 minutes, 03-04 times a day.  Only take medicine as told by your doctor.  Rest the injured area.  If possible, raise (elevate) the injured area to lessen puffiness. GET HELP RIGHT AWAY IF:   You have more bruising or puffiness.  You have pain that is getting worse.  Your puffiness or pain is not helped by medicine. MAKE SURE YOU:   Understand these instructions.  Will watch your condition.  Will get help right away if you are not doing well or get worse. Document Released: 05/06/2008 Document Revised: 02/10/2012 Document Reviewed: 09/23/2011 [facility] Patient Information 2015 [place]Care, [place]LL[place]. This information is not intended to replace advice given to you by your health care provider. Make sure you discuss any questions you have with your health care provider.

## 2014-11-23 NOTE — ED Provider Notes (Signed)
CSN: [id number]     Arrival date & time 11/23/14  1510 History   First MD Initiated Contact with Patient 11/23/14 1519     Chief Complaint  Patient presents with  . Back Pain   (Consider location/radiation/quality/duration/timing/severity/associated sxs/prior Treatment) HPI Comments: 28 year old female was at home this morning she fell down some steps for her porch. Her primary complaint is that of left hip pain. She is unable to bear full weight. The pain circumscribes the entire hip posterior, lateral and anterior. She is also complaining of pain to the bilateral trapezial musculature, the rhomboid and deltoid muscles. Denies injury to the head or neck.   Past Medical History  Diagnosis Date  . Heart murmur   . Diabetes mellitus    Past Surgical History  Procedure Laterality Date  . Dental surgery     No family history on file. History  Substance Use Topics  . Smoking status: Current Every Day Smoker    Types: Cigarettes  . Smokeless tobacco: Not on file  . Alcohol Use: No   OB History    No data available     Review of Systems  Constitutional: Positive for activity change. Negative for fever.  HENT: Negative.   Respiratory: Negative for cough.   Cardiovascular: Negative for chest pain and leg swelling.  Gastrointestinal: Negative.   Genitourinary: Negative.   Musculoskeletal: Positive for myalgias and back pain. Negative for neck pain and neck stiffness.  Skin: Negative.   Neurological: Negative for dizziness and headaches.    Allergies  Onion; Other; Almond oil; Soy allergy; and Latex  Home Medications   Prior to Admission medications   Medication Sig Start Date End Date Taking? Authorizing Provider  fluticasone (FLONASE) 50 MCG/ACT nasal spray Place 2 sprays into both nostrils daily. 01/11/14   Mellody DrownLauren Parker, PA-C  ketorolac (TORADOL) 10 MG tablet Take 1 tablet (10 mg total) by mouth 4 (four) times daily as needed. 11/23/14   Hayden Rasmussenavid Khyleigh Furney, NP  oxymetazoline  (AFRIN NASAL SPRAY) 0.05 % nasal spray Place 1 spray into both nostrils 2 (two) times daily. 01/11/14   Mellody DrownLauren Parker, PA-C  traMADol (ULTRAM) 50 MG tablet Take 1 tablet (50 mg total) by mouth every 6 (six) hours as needed. 11/23/14   Hayden Rasmussenavid Darletta Noblett, NP   BP 125/82 mmHg  Pulse 87  Temp(Src) 98.1 F (36.7 C) (Oral)  Resp 16  SpO2 98%  LMP 10/26/2014 Physical Exam  Constitutional: She is oriented to person, place, and time. She appears well-developed and well-nourished. No distress.  HENT:  Head: Normocephalic and atraumatic.  Eyes: Conjunctivae and EOM are normal.  Neck: Normal range of motion.  Cardiovascular: Normal rate.   Pulmonary/Chest: Effort normal and breath sounds normal. No respiratory distress.  Musculoskeletal: She exhibits tenderness. She exhibits no edema.  Tenderness to the bilateral trapezii muscles and lesser to the deltoids. She is able to abduct to 90, limited to muscle pain. No deformities. Full range of motion of the elbows wrist and hands. Full range of motion of the neck.  Tenderness to the left lower back musculature,upper parathoracic spinal musculature;  left posterior hip, lateral hip and the posterior lateral and anterior femur. She ambulates with a lamp and partial weightbearing to the left leg. No appreciable swelling. Distal neurovascular motor sensory is intact. While supine hip flexion to 100. Passive flexion 110. Internal and external rotation limited due to pain in the surrounding musculature. Positive for bony tenderness in the trochanter. No spinal tenderness or deformity.  Neurological:  She is alert and oriented to person, place, and time.  Skin: Skin is warm and dry.  Psychiatric: She has a normal mood and affect.  Nursing note and vitals reviewed.   ED Course  Procedures (including critical care time) Labs Review Labs Reviewed - No data to display  Imaging Review Dg Hip Complete Left  11/23/2014   CLINICAL DATA:  Patient slipped and feel on  back porch this morning now with left posterior hip pain.  EXAM: LEFT HIP - COMPLETE 2+ VIEW  COMPARISON:  None.  FINDINGS: No fracture or dislocation. Left hip joint spaces are preserved. Incidental note is made of a tiny os acetabuli I. no evidence of avascular necrosis. Regional soft tissues appear normal. No radiopaque foreign body.  IMPRESSION: No acute findings.   Electronically Signed   By: Simonne ComeJohn  Watts M.D.   On: 11/23/2014 17:07     MDM   1. Contusion of left hip and thigh, initial encounter   2. Fall at home, initial encounter   3. Hip pain   4. Myalgia, traumatic    Cold apps toradol 10 mg as dir with food Tramadol 50 mg #15 F/U with PCP    Hayden Rasmussenavid Shatima Zalar, NP 11/23/14 1718

## 2014-11-23 NOTE — ED Notes (Signed)
Reports she fell backwards this am; porch C/o upper back pain See physicians note Slow gait  Alert, no signs of acute distress.

## 2014-11-26 ENCOUNTER — Encounter (HOSPITAL_COMMUNITY): Payer: Self-pay | Admitting: *Deleted

## 2014-11-26 ENCOUNTER — Emergency Department (HOSPITAL_COMMUNITY)
Admission: EM | Admit: 2014-11-26 | Discharge: 2014-11-26 | Disposition: A | Discharge status: Home / Self Care | Payer: Medicaid Other | Attending: Emergency Medicine | Admitting: Emergency Medicine

## 2014-11-26 DIAGNOSIS — Z91040 Latex allergy status: Secondary | ICD-10-CM | POA: Insufficient documentation

## 2014-11-26 DIAGNOSIS — M545 Low back pain, unspecified: Secondary | ICD-10-CM

## 2014-11-26 DIAGNOSIS — Z7951 Long term (current) use of inhaled steroids: Secondary | ICD-10-CM | POA: Diagnosis not present

## 2014-11-26 DIAGNOSIS — Z720 Tobacco use: Secondary | ICD-10-CM | POA: Insufficient documentation

## 2014-11-26 DIAGNOSIS — E119 Type 2 diabetes mellitus without complications: Secondary | ICD-10-CM | POA: Insufficient documentation

## 2014-11-26 DIAGNOSIS — Z3202 Encounter for pregnancy test, result negative: Secondary | ICD-10-CM | POA: Diagnosis not present

## 2014-11-26 DIAGNOSIS — R011 Cardiac murmur, unspecified: Secondary | ICD-10-CM | POA: Diagnosis not present

## 2014-11-26 DIAGNOSIS — M549 Dorsalgia, unspecified: Secondary | ICD-10-CM | POA: Diagnosis present

## 2014-11-26 LAB — URINE MICROSCOPIC-ADD ON

## 2014-11-26 LAB — POC URINE PREG, ED: Preg Test, Ur: NEGATIVE

## 2014-11-26 LAB — URINALYSIS, ROUTINE W REFLEX MICROSCOPIC
Bilirubin Urine: NEGATIVE
GLUCOSE, UA: NEGATIVE mg/dL
HGB URINE DIPSTICK: NEGATIVE
KETONES UR: NEGATIVE mg/dL
Nitrite: NEGATIVE
PH: 7 (ref 5.0–8.0)
PROTEIN: NEGATIVE mg/dL
Specific Gravity, Urine: 1.027 (ref 1.005–1.030)
Urobilinogen, UA: 1 mg/dL (ref 0.0–1.0)

## 2014-11-26 MED ORDER — CYCLOBENZAPRINE HCL 10 MG PO TABS: 10.0000 mg | ORAL_TABLET | Freq: Two times a day (BID) | ORAL | Status: DC | PRN

## 2014-11-26 MED ORDER — IBUPROFEN 800 MG PO TABS: 800.0000 mg | ORAL_TABLET | Freq: Three times a day (TID) | ORAL | Status: DC

## 2014-11-26 NOTE — ED Notes (Signed)
PA at the bedside.

## 2014-11-26 NOTE — ED Notes (Signed)
Pt states woke up today with lower back mid to right sided pain, no abdominal pain, urinary symptoms or vag issues.

## 2014-11-26 NOTE — ED Notes (Signed)
Called unable to locate. X1

## 2014-11-26 NOTE — ED Provider Notes (Signed)
CSN: [id number]     Arrival date & time 11/26/14  1748 History  This chart was scribed for non-physician practitioner working with Linwood DibblesJon Knapp, MD by Elveria Risingimelie Horne, ED Scribe. This patient was seen in room TR04C/TR04C and the patient's care was started at 8:21 PM.   Chief Complaint  Patient presents with  . Back Pain   The history is provided by the patient. No language interpreter was used.   HPI Comments: Sabrina Griffin is a 28 y.o. female with PMHx of Diabetes Mellitus and heart murmur who presents to the Emergency Department complaining of back pain, onset early this morning. Patient reports wakening at 3am with severe lower back pain felt when turning over. Patient reports that she has exacerbated pain with walking, standing fully erect, sitting coughing and sneezing. Patient reports alleviation when lying supine with heating pad. Patient [facility] for complaint of left hip pain after a fall down her the steps of her porch. Patient discharged with Tramadol and Toradol. Patient states that she was informed she had a bruised muscle. Patient reports improvement with treatment and states that she went to sleep last night she was without pain. Patient denies weakness, fever, bladder/bowel incontinence, or groin numbness. Patient denies history of similar back pain. Patient denies history of IV drug use, history of cancer.   Past Medical History  Diagnosis Date  . Heart murmur   . Diabetes mellitus    Past Surgical History  Procedure Laterality Date  . Dental surgery     No family history on file. History  Substance Use Topics  . Smoking status: Current Every Day Smoker    Types: Cigarettes  . Smokeless tobacco: Not on file  . Alcohol Use: No   OB History    No data available     Review of Systems  Constitutional: Negative for fever and chills.  Gastrointestinal: Negative for nausea, vomiting and diarrhea.  Genitourinary: Negative for hematuria.   Musculoskeletal: Positive for back pain. Negative for gait problem.  Skin: Negative for wound.  Neurological: Negative for weakness and numbness.    Allergies  Onion; Other; Almond oil; Soy allergy; and Latex  Home Medications   Prior to Admission medications   Medication Sig Start Date End Date Taking? Authorizing Provider  fluticasone (FLONASE) 50 MCG/ACT nasal spray Place 2 sprays into both nostrils daily. 01/11/14   Mellody DrownLauren Parker, PA-C  ketorolac (TORADOL) 10 MG tablet Take 1 tablet (10 mg total) by mouth 4 (four) times daily as needed. 11/23/14   Hayden Rasmussenavid Mabe, NP  oxymetazoline (AFRIN NASAL SPRAY) 0.05 % nasal spray Place 1 spray into both nostrils 2 (two) times daily. 01/11/14   Mellody DrownLauren Parker, PA-C  traMADol (ULTRAM) 50 MG tablet Take 1 tablet (50 mg total) by mouth every 6 (six) hours as needed. 11/23/14   Hayden Rasmussenavid Mabe, NP   Triage Vitals: BP 108/64 mmHg  Pulse 84  Temp(Src) 97.9 F (36.6 C) (Oral)  Resp 18  SpO2 100%  LMP 11/05/2014 Physical Exam  Constitutional: She is oriented to person, place, and time. She appears well-developed and well-nourished. No distress.  HENT:  Head: Normocephalic and atraumatic.  Eyes: EOM are normal.  Neck: Neck supple. No tracheal deviation present.  Cardiovascular: Normal rate.   Pulmonary/Chest: Effort normal. No respiratory distress.  Musculoskeletal: She exhibits tenderness.       Back:  L5-S1 pain spinous processes. No step offs, deformity, abrasions, ecchymosis, erythema, swelling, signs of obvious injury. Patient has full  range of motion of back.  Neurological: She is alert and oriented to person, place, and time. She has normal strength and normal reflexes. No cranial nerve deficit or sensory deficit. Gait normal. GCS eye subscore is 4. GCS verbal subscore is 5. GCS motor subscore is 6.  Patient fully alert answering questions appropriately in full, clear sentences. Motor strength 5 out of 5 in all major muscle groups of upper and lower  extremity. Cranial nerves II through XII grossly intact. Distal sensation intact.  Skin: Skin is warm and dry.  Psychiatric: She has a normal mood and affect. Her behavior is normal.  Nursing note and vitals reviewed.   ED Course  Procedures (including critical care time)  COORDINATION OF CARE: 8:30 PM- Discussed treatment plan with patient at bedside and patient agreed to plan.   Labs Review Labs Reviewed  URINALYSIS, ROUTINE W REFLEX MICROSCOPIC - Abnormal; Notable for the following:    APPearance CLOUDY (*)    Leukocytes, UA SMALL (*)    All other components within normal limits  URINE MICROSCOPIC-ADD ON - Abnormal; Notable for the following:    Squamous Epithelial / LPF MANY (*)    Bacteria, UA MANY (*)    All other components within normal limits  POC URINE PREG, ED    Imaging Review No results found.   EKG Interpretation None      MDM   Final diagnoses:  None    Patient with back pain.  No obvious traumatic event or injury based on history. No neurological deficits and normal neuro exam.  Patient can walk but states is painful.  No loss of bowel or bladder control.  No concern for cauda equina.  No fever, night sweats, weight loss, h/o cancer, IVDU.  RICE protocol and pain medicine indicated and discussed with patient. I discussed return precautions with patient and patient was agreeable to this plan. I encouraged patient to call or return to the ER should she have any questions or concerns.  I personally performed the services described in this documentation, which was scribed in my presence. The recorded information has been reviewed and is accurate.  BP 105/53 mmHg  Pulse 75  Temp(Src) 98.2 F (36.8 C) (Oral)  Resp 18  SpO2 100%  LMP 11/05/2014  Signed,  Ladona MowJoe Cullin Dishman, PA-C 4:23 AM  .  Monte FantasiaJoseph W Dauntae Derusha, PA-C 11/27/14 0423  Linwood DibblesJon Knapp, MD 11/27/14 [phone]

## 2014-11-26 NOTE — Discharge Instructions (Signed)
Back Pain, Adult °Low back pain is very common. About 1 in 5 people have back pain. The cause of low back pain is rarely dangerous. The pain often gets better over time. About half of people with a sudden onset of back pain feel better in just 2 weeks. About 8 in 10 people feel better by 6 weeks.  °CAUSES °Some common causes of back pain include: °· Strain of the muscles or ligaments supporting the spine. °· Wear and tear (degeneration) of the spinal discs. °· Arthritis. °· Direct injury to the back. °DIAGNOSIS °Most of the time, the direct cause of low back pain is not known. However, back pain can be treated effectively even when the exact cause of the pain is unknown. Answering your caregiver's questions about your overall health and symptoms is one of the most accurate ways to make sure the cause of your pain is not dangerous. If your caregiver needs more information, he or she may order lab work or imaging tests (X-rays or MRIs). However, even if imaging tests show changes in your back, this usually does not require surgery. °HOME CARE INSTRUCTIONS °For many people, back pain returns. Since low back pain is rarely dangerous, it is often a condition that people can learn to manage on their own.  °· Remain active. It is stressful on the back to sit or stand in one place. Do not sit, drive, or stand in one place for more than 30 minutes at a time. Take short walks on level surfaces as soon as pain allows. Try to increase the length of time you walk each day. °· Do not stay in bed. Resting more than 1 or 2 days can delay your recovery. °· Do not avoid exercise or work. Your body is made to move. It is not dangerous to be active, even though your back may hurt. Your back will likely heal faster if you return to being active before your pain is gone. °· Pay attention to your body when you  bend and lift. Many people have less discomfort when lifting if they bend their knees, keep the load close to their bodies, and  avoid twisting. Often, the most comfortable positions are those that put less stress on your recovering back. °· Find a comfortable position to sleep. Use a firm mattress and lie on your side with your knees slightly bent. If you lie on your back, put a pillow under your knees. °· Only take over-the-counter or prescription medicines as directed by your caregiver. Over-the-counter medicines to reduce pain and inflammation are often the most helpful. Your caregiver may prescribe muscle relaxant drugs. These medicines help dull your pain so you can more quickly return to your normal activities and healthy exercise. °· Put ice on the injured area. °¨ Put ice in a plastic bag. °¨ Place a towel between your skin and the bag. °¨ Leave the ice on for 15-20 minutes, 03-04 times a day for the first 2 to 3 days. After that, ice and heat may be alternated to reduce pain and spasms. °· Ask your caregiver about trying back exercises and gentle massage. This may be of some benefit. °· Avoid feeling anxious or stressed. Stress increases muscle tension and can worsen back pain. It is important to recognize when you are anxious or stressed and learn ways to manage it. Exercise is a great option. °SEEK MEDICAL CARE IF: °· You have pain that is not relieved with rest or medicine. °· You have pain that does not improve in 1 week. °· You have new symptoms. °· You are generally not feeling well. °SEEK   IMMEDIATE MEDICAL CARE IF:  °· You have pain that radiates from your back into your legs. °· You develop new bowel or bladder control problems. °· You have unusual weakness or numbness in your arms or legs. °· You develop nausea or vomiting. °· You develop abdominal pain. °· You feel faint. °Document Released: 11/18/2005 Document Revised: 05/19/2012 Document Reviewed: 03/22/2014 °ExitCare® Patient Information ©2015 [employer]. This information is not intended to replace advice given to you by your health care provider. Make sure you  discuss any questions you have with your health care provider. ° ° °Back Exercises °Back exercises help treat and prevent back injuries. The goal of back exercises is to increase the strength of your abdominal and back muscles and the flexibility of your back. These exercises should be started when you no longer have back pain. Back exercises include: °· Pelvic Tilt. Lie on your back with your knees bent. Tilt your pelvis until the lower part of your back is against the floor. Hold this position 5 to 10 sec and repeat 5 to 10 times. °· Knee to Chest. Pull first 1 knee up against your chest and hold for 20 to 30 seconds, repeat this with the other knee, and then both knees. This may be done with the other leg straight or bent, whichever feels better. °· Sit-Ups or Curl-Ups. Bend your knees 90 degrees. Start with tilting your pelvis, and do a partial, slow sit-up, lifting your trunk only 30 to 45 degrees off the floor. Take at least 2 to 3 seconds for each sit-up. Do not do sit-ups with your knees out straight. If partial sit-ups are difficult, simply do the above but with only tightening your abdominal muscles and holding it as directed. °· Hip-Lift. Lie on your back with your knees flexed 90 degrees. Push down with your feet and shoulders as you raise your hips a couple inches off the floor; hold for 10 seconds, repeat 5 to 10 times. °· Back arches. Lie on your stomach, propping yourself up on bent elbows. Slowly press on your hands, causing an arch in your low back. Repeat 3 to 5 times. Any initial stiffness and discomfort should lessen with repetition over time. °· Shoulder-Lifts. Lie face down with arms beside your body. Keep hips and torso pressed to floor as you slowly lift your head and shoulders off the floor. °Do not overdo your exercises, especially in the beginning. Exercises may cause you some mild back discomfort which lasts for a few minutes; however, if the pain is more severe, or lasts for more than  15 minutes, do not continue exercises until you see your caregiver. Improvement with exercise therapy for back problems is slow.  °See your caregivers for assistance with developing a proper back exercise program. °Document Released: 12/26/2004 Document Revised: 02/10/2012 Document Reviewed: 09/19/2011 °ExitCare® Patient Information ©2015 [employer]. This information is not intended to replace advice given to you by your health care provider. Make sure you discuss any questions you have with your health care provider. ° ° °Emergency Department Resource Guide °1) Find a Doctor and Pay Out of Pocket °Although you won't have to find out who is covered by your insurance plan, it is a good idea to ask around and get recommendations. You will then need to call the office and see if the doctor you have chosen will accept you as a new patient and what types of options they offer for patients who are self-pay. Some doctors offer discounts or   will set up payment plans for their patients who do not have insurance, but you will need to ask so you aren't surprised when you get to your appointment. ° °2) Contact Your Local Health Department °Not all health departments have doctors that can see patients for sick visits, but many do, so it is worth a call to see if yours does. If you don't know where your local health department is, you can check in your phone book. The CDC also has a tool to help you locate your state's health department, and many state websites also have listings of all of their local health departments. ° °3) Find a Walk-in Clinic °If your illness is not likely to be very severe or complicated, you may want to try a walk in clinic. These are popping up all over the country in pharmacies, drugstores, and shopping centers. They're usually staffed by nurse practitioners or physician assistants that have been trained to treat common illnesses and complaints. They're usually fairly quick and inexpensive. However,  if you have serious medical issues or chronic medical problems, these are probably not your best option. ° °No Primary Care Doctor: °- Call [facility] at  832-8000 - they can help you locate a primary care doctor that  accepts your insurance, provides certain services, etc. °- Physician Referral Service- 1-800-533-3463 ° °Chronic Pain Problems: °Organization         Address  Phone   Notes  °[facility]  (336) 297-2271 Patients need to be referred by their primary care doctor.  ° °Medication Assistance: °Organization         Address  Phone   Notes  °[place] [facility] [address] °[place], [place] [address] (336) 641-8030 --Must be a resident of [place] °-- Must have NO insurance coverage whatsoever (no Medicaid/ Medicare, etc.) °-- The pt. MUST have a primary care doctor that directs their care regularly and follows them in the community °  °[facility]  (866) 331-1348   °[employer]  (888) 892-1162   ° °Agencies that provide inexpensive medical care: °Organization         Address  Phone   Notes  °[facility]  (336) 832-8035   °[facility]    (336) 832-7272   °[facility] [address] °[place], [place] [address] (336) 832-4777   °[facility] [address], °[place] (336) 271-4999   °[facility]    (336) 373-0678   °[facility]    (336) 272-1050   °[facility] ° [address], [place] Phone:  (336) 832-4444, Fax:  (336) 832-4440 Hours of Operation:  9 am - 6 pm, M-F.  Also accepts Medicaid/Medicare and self-pay.  °[facility] ° [address], [place] Phone: (336) 832-3150, Fax: (336) 832-3151. Hours of Operation:  8:30 am - 5:30 pm, M-F.  Also accepts Medicaid and self-pay.  °[facility] [place] [address], [place] Phone: (336) 878-6027   °[facility] [address], [place], [place] (336)723-1848, Ext. 123 Mondays & Thursdays: 7-9 AM.  First 15 patients are seen on a first come, first serve basis. °  ° °Medicaid-accepting [place] Providers: ° °Organization         Address  Phone   Notes  °[facility] [address],  (336)   641-2100 Also accepts self-pay patients.  °[facility] [address], [place] ° (336) 856-9996   °[facility] [address], [place] (336) 288-8857   °[facility] [address] [place] [address], [place] (336) 299-7000   °Veita Bland [address], [place]  ° (336) 373-1557 Only accepts [place] Access Medicaid patients after they have their name applied to their card.  ° °Self-Pay (no insurance) in [place]: ° °Organization         Address  Phone   Notes  °Sickle Cell Patients, [facility] [address], [place] (336) 832-1970   °[facility] [address], [place] (336) 832-4400   °[facility] [place] ° [address],  (336) 992-4800   °[facility]/Dr. Osei-Bonsu ° [address] [place] [address], [place] or [address], [place] (336) 841-8500 Phone number for both [place] and [place] locations is the same.  °[facility] [address], [place] (336) 299-0000   °[facility] [address] [place] [address], [place] or [address] (336) 852-7530 °(336) 878-2260   °[facility] [address], [place] (336) 350-1642, phone; (336) 294-5005, fax Sees patients 1st and 3rd Saturday of every month.  Must not qualify for public or private insurance (i.e. Medicaid, Medicare, [place] Health Choice, Veterans' Benefits) • Household income should be no more than 200% of the poverty level •The clinic cannot treat you if you are pregnant or think you are  pregnant • Sexually transmitted diseases are not treated at the clinic.  ° ° °Dental Care: °Organization         Address  Phone  Notes  °[facility] [address], [place] (336) 641-6152 Accepts children up to age 21 who are enrolled in Medicaid or [place] Health Choice; pregnant women with a Medicaid card; and children who have applied for Medicaid or [place] Health Choice, but were declined, whose parents can pay a reduced fee at time of service.  °[facility] [place]  [address], [place] (336) 641-7733 Accepts children up to age 21 who are enrolled in Medicaid or [place] Health Choice; pregnant women with a Medicaid card; and children who have applied for Medicaid or [place] Health Choice, but were declined, whose parents can pay a reduced fee at time of service.  °[facility] ° [address], [place] (336) 641-4533 Patients are seen by appointment only. Walk-ins are not accepted. [facility] will see patients 18 years of age and older. °Monday - Tuesday (8am-5pm) °Most Wednesdays (8:30-5pm) °$30 per visit, cash only  °[facility] ° [address], [place] (336) 641-4533 Patients are seen by appointment only. Walk-ins are not accepted. [facility] will see patients 18 years of age and older. °One Wednesday Evening (Monthly: Volunteer Based).  $30 per visit, cash only  °[facility]  (919) 537-3737 for adults; Children under age 4, call Graduate Pediatric Dentistry at ([phone]) [phone]. Children aged 4-14, please call (919) [phone] to request a pediatric application. ° Dental services are provided in all areas of dental care including fillings, crowns and bridges, complete and partial dentures, implants, gum treatment, root canals, and extractions. Preventive care is also provided. Treatment   is provided to both adults and  children. °Patients are selected via a lottery and there is often a waiting list. °  °[facility] [address], °[place] ° (336) 763-8833 [email or web address] °  °[facility] [address], [place], [place] (336)723-1848, Ext. 123 Second and Fourth Thursday of each month, opens at 6:30 AM; Clinic ends at 9 AM.  Patients are seen on a first-come first-served basis, and a limited number are seen during each clinic.  ° °[facility] ° [address], [place], [place] (336) 723-7904   Eligibility Requirements °You must have lived in [place], or [place] counties for at least the last three months. °  You cannot be eligible for state or federal sponsored healthcare insurance, including Veterans Administration, Medicaid, or Medicare. °  You generally cannot be eligible for healthcare insurance through your employer.  °  How to apply: °Eligibility screenings are held every Tuesday and Wednesday afternoon from 1:00 pm until 4:00 pm. You do not need an appointment for the interview!  °[facility] [address], [place], [place] 336-631-2330   °[facility]  336-342-8273   °[facility]  336-703-3100   °[facility]  336-570-6415   ° °Behavioral Health Resources in the Community: °Intensive Outpatient Programs °Organization         Address  Phone  Notes  °[facility] [address], [place], [place] 336-878-6098   °[facility] [address], [place], [place] 336-832-9800   °[facility] [address], [place], [place] ° 336-882-2125   °[facility] [address],  °[place], [place] 1-800-853-5163 or 336-641-4981   °Substance Abuse Resources °Organization         Address  Phone  Notes  °[facility]  336-882-2125   °[facility]  336-784-9470   °[facility]  336-285-9073    °[facility]  336-845-3988   °Residential & Outpatient Substance Abuse Program  1-800-659-3381   °Psychological Services °Organization         Address  Phone  Notes  °[facility]  336- 832-9600   °[facility]  336- 378-7881   °[facility] [address], [place] 1-800-853-5163 or 336-641-4981   ° °Mobile Crisis Teams °Organization         Address  Phone  Notes  °[facility], Mobile Crisis Care Unit  1-877-626-1772   °Assertive °[facility] ° [address] [place], [place] 336-834-9664   °Sharon DeEsch [address] °[place] 336-554-5454   ° °Self-Help/Support Groups °Organization         Address  Phone             Notes  °[facility] [place] - variety of support groups  336- 373-1402 Call for more information  °Narcotics Anonymous (NA), [facility] [address], °[place]  2 meetings at this location  ° °Residential Treatment Programs °Organization         Address  Phone  Notes  °[facility] [address],    °[place]  1-866-801-8205   °[facility] ° [address], [place], [place] 704-293-8524   °[facility] [address], [place] 336-845-3988 Admissions: 8am-3pm M-F  °[facility] [address]    °[place],   [place] 336-841-1104   °[facility] [address], [place], [place] 336-379-7146   °[facility] [address]  °[place], [place] 336-285-9073   °[facility] - Intensive Outpatient [address], [place], [place] 336-852-3033   °[facility] [address]  °[place], [place] 1-877-615-2722 or 336-784-9470   °[facility] [address] [place], [place] 336-227-7417 Accepts Medicaid  °[facility] [address]  °[place] 1-800-659-3381 Substance Abuse/Addiction Treatment  ° °[place]  Behavioral Health Resources °Organization         Address  Phone  Notes  °[facility]  (888) 581-9988   °Julie Brannon, PhD [address] [place], [place]   (336) 349-5553 or (336) 951-0000   °[facility]   [address] °[place], [place] (336) 349-4454   °[facility] [address], [place], [place] (336) 342-8316 Insurance/Medicaid/sponsorship through [facility]  °[facility] [address]                                    [place], [place] (336) 342-8316 Therapy/tele-psych/case  °[facility] [address]  ° [place], [place] (336) 349-2233    °Dr. Arfeen  (336) 349-4544   °[facility] [place]  [employer] [place] [facility] 1) [address], [place] °2) [address], [place] °3)  [address], [place] (336) 349-3220 °(336) 342-7768 ° °(336) 342-8140   °[place] [facility] (336) 342-1394 or (336) 342-3537 (After Hours)    ° ° ° °

## 2014-11-26 NOTE — ED Notes (Signed)
Pt states she was fine when she went to bed. States when she awoke this morning she couldn't get out of bed because of the back pain. Denies any numbness or weakness in her legs. Denies incontinence of bowel and bladder. Denies injury.

## 2015-04-28 ENCOUNTER — Encounter (HOSPITAL_COMMUNITY): Payer: Self-pay | Admitting: Physical Medicine and Rehabilitation

## 2015-04-28 ENCOUNTER — Emergency Department (HOSPITAL_COMMUNITY)
Admission: EM | Admit: 2015-04-28 | Discharge: 2015-04-28 | Disposition: A | Discharge status: Home / Self Care | Payer: Medicaid Other | Attending: Emergency Medicine | Admitting: Emergency Medicine

## 2015-04-28 DIAGNOSIS — R202 Paresthesia of skin: Secondary | ICD-10-CM

## 2015-04-28 DIAGNOSIS — Z720 Tobacco use: Secondary | ICD-10-CM | POA: Diagnosis not present

## 2015-04-28 DIAGNOSIS — Z79899 Other long term (current) drug therapy: Secondary | ICD-10-CM | POA: Insufficient documentation

## 2015-04-28 DIAGNOSIS — R011 Cardiac murmur, unspecified: Secondary | ICD-10-CM | POA: Diagnosis not present

## 2015-04-28 DIAGNOSIS — M79601 Pain in right arm: Secondary | ICD-10-CM | POA: Diagnosis present

## 2015-04-28 DIAGNOSIS — Z7952 Long term (current) use of systemic steroids: Secondary | ICD-10-CM | POA: Insufficient documentation

## 2015-04-28 DIAGNOSIS — E119 Type 2 diabetes mellitus without complications: Secondary | ICD-10-CM | POA: Insufficient documentation

## 2015-04-28 MED ORDER — CYCLOBENZAPRINE HCL 10 MG PO TABS: 10.0000 mg | ORAL_TABLET | Freq: Two times a day (BID) | ORAL | Status: DC | PRN

## 2015-04-28 MED ORDER — PREDNISONE 20 MG PO TABS
60.0000 mg | ORAL_TABLET | Freq: Once | ORAL | Status: AC
  Administered 2015-04-28: 60 mg via ORAL
  Filled 2015-04-28: qty 3

## 2015-04-28 MED ORDER — PREDNISONE 10 MG PO TABS: ORAL_TABLET | ORAL | Status: DC

## 2015-04-28 NOTE — Discharge Instructions (Signed)
Take prednisone as prescribed until all gone. Flexeril for spasms. Please follow up with primary care doctor or orthopedics as referred if symptoms continue. No lifting more than 5 lbs for the next week.  Paresthesia Paresthesia is an abnormal burning or prickling sensation. This sensation is generally felt in the hands, arms, legs, or feet. However, it may occur in any part of the body. It is usually not painful. The feeling may be described as:  Tingling or numbness.  "Pins and needles."  Skin crawling.  Buzzing.  Limbs "falling asleep."  Itching. Most people experience temporary (transient) paresthesia at some time in their lives. CAUSES  Paresthesia may occur when you breathe too quickly (hyperventilation). It can also occur without any apparent cause. Commonly, paresthesia occurs when pressure is placed on a nerve. The feeling quickly goes away once the pressure is removed. For some people, however, paresthesia is a long-lasting (chronic) condition caused by an underlying disorder. The underlying disorder may be:  A traumatic, direct injury to nerves. Examples include a:  Broken (fractured) neck.  Fractured skull.  A disorder affecting the brain and spinal cord (central nervous system). Examples include:  Transverse myelitis.  Encephalitis.  Transient ischemic attack.  Multiple sclerosis.  Stroke.  Tumor or blood vessel problems, such as an arteriovenous malformation pressing against the brain or spinal cord.  A condition that damages the peripheral nerves (peripheral neuropathy). Peripheral nerves are not part of the brain and spinal cord. These conditions include:  Diabetes.  Peripheral vascular disease.  Nerve entrapment syndromes, such as carpal tunnel syndrome.  Shingles.  Hypothyroidism.  Vitamin B12 deficiencies.  Alcoholism.  Heavy metal poisoning (lead, arsenic).  Rheumatoid arthritis.  Systemic lupus erythematosus. DIAGNOSIS  Your caregiver  will attempt to find the underlying cause of your paresthesia. Your caregiver may:  Take your medical history.  Perform a physical exam.  Order various lab tests.  Order imaging tests. TREATMENT  Treatment for paresthesia depends on the underlying cause. HOME CARE INSTRUCTIONS  Avoid drinking alcohol.  You may consider massage or acupuncture to help relieve your symptoms.  Keep all follow-up appointments as directed by your caregiver. SEEK IMMEDIATE MEDICAL CARE IF:   You feel weak.  You have trouble walking or moving.  You have problems with speech or vision.  You feel confused.  You cannot control your bladder or bowel movements.  You feel numbness after an injury.  You faint.  Your burning or prickling feeling gets worse when walking.  You have pain, cramps, or dizziness.  You develop a rash. MAKE SURE YOU:  Understand these instructions.  Will watch your condition.  Will get help right away if you are not doing well or get worse. Document Released: 11/08/2002 Document Revised: 02/10/2012 Document Reviewed: 08/09/2011 [facility] Patient Information 2015 [employer], [place]. This information is not intended to replace advice given to you by your health care provider. Make sure you discuss any questions you have with your health care provider.

## 2015-04-28 NOTE — ED Provider Notes (Signed)
CSN: [id number]     Arrival date & time 04/28/15  1103 History  This chart was scribed for Jaynie Crumble, PA-C working with Glynn Octave, MD by Elveria Rising, ED Scribe. This patient was seen in room TR08C/TR08C and the patient's care was started at 12:42 PM.   Chief Complaint  Patient presents with  . Arm Pain  . Hand Pain  . Shoulder Pain   The history is provided by the patient. No language interpreter was used.   HPI Comments: Sabrina Griffin is a 29 y.o. female who presents to the Emergency Department complaining of constant dull burning, pins and needles pain in right hand, arm and shoulder pain for several weeks now. 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 Patient reports that she has to sleep with pillow propped under her arm to relief some discomfort so that she is able to eat. Patient reports previous injury to right hand stating that tore extensor tendons on her hand after a box fell backwards while pulling it down. Patient is right hand dominant.   Patient shares that [facility] but she was informed that she had to be there by 7:30am; first come first serve basis.   Past Medical History  Diagnosis Date  . Heart murmur   . Diabetes mellitus    Past Surgical History  Procedure Laterality Date  . Dental surgery     No family history on file. History  Substance Use Topics  . Smoking status: Current Every Day Smoker    Types: Cigarettes  . Smokeless tobacco: Not on file  . Alcohol Use: No   OB History    No data available     Review of Systems  Constitutional: Negative for fever.  Musculoskeletal: Positive for myalgias and arthralgias.  Neurological: Positive for numbness. Negative for weakness.      Allergies  Onion; Other; Almond oil; Soy  allergy; and Latex  Home Medications   Prior to Admission medications   Medication Sig Start Date End Date Taking? Authorizing Provider  cyclobenzaprine (FLEXERIL) 10 MG tablet Take 1 tablet (10 mg total) by mouth 2 (two) times daily as needed for muscle spasms. 11/26/14   Ladona Mow, PA-C  fluticasone (FLONASE) 50 MCG/ACT nasal spray Place 2 sprays into both nostrils daily. 01/11/14   Mellody Drown, PA-C  ibuprofen (ADVIL,MOTRIN) 800 MG tablet Take 1 tablet (800 mg total) by mouth 3 (three) times daily. 11/26/14   Ladona Mow, PA-C  ketorolac (TORADOL) 10 MG tablet Take 1 tablet (10 mg total) by mouth 4 (four) times daily as needed. 11/23/14   Hayden Rasmussen, NP  oxymetazoline (AFRIN NASAL SPRAY) 0.05 % nasal spray Place 1 spray into both nostrils 2 (two) times daily. 01/11/14   Mellody Drown, PA-C  traMADol (ULTRAM) 50 MG tablet Take 1 tablet (50 mg total) by mouth every 6 (six) hours as needed. 11/23/14   Hayden Rasmussen, NP   Triage Vitals: BP 125/59 mmHg  Pulse 85  Temp(Src) 98.3 F (36.8 C) (Oral)  Resp 18  Ht  (1.753 m)  Wt 157 lb (71.215 kg)  BMI 23.17 kg/m2  SpO2 100% Physical Exam  Constitutional: She is oriented to person, place, and time. She appears well-developed and well-nourished. No distress.  HENT:  Head: Normocephalic and atraumatic.  Eyes: EOM are  normal.  Neck: Neck supple. No tracheal deviation present.  Cardiovascular: Normal rate.   Pulmonary/Chest: Effort normal. No respiratory distress.  Musculoskeletal: Normal range of motion.  Normal-appearing right shoulder, right elbow, right wrist and hand. No joint swelling, no arm swelling. Distal radial pulse intact and equal bilaterally. 5 out of 5 and equal grip strength bilaterally. Patient is able to spread all the fingers. Do thumbs up. I'll post her thumb to every finger. Sensation is intact at this time. Full range of motion of the wrist, elbow, shoulder. There is tenderness to palpation over right trapezius, over right  shoulder joint. There is pain with full range of motion. The bicep, tricep intact.  Neurological: She is alert and oriented to person, place, and time.  Skin: Skin is warm and dry.  Psychiatric: She has a normal mood and affect. Her behavior is normal.  Nursing note and vitals reviewed.   ED Course  Procedures (including critical care time)  COORDINATION OF CARE: 12:52 PM- Discussed treatment plan with patient's parent at bedside and parent agreed to plan.   Labs Review Labs Reviewed - No data to display  Imaging Review No results found.   EKG Interpretation None      MDM   Final diagnoses:  Arm paresthesia, right     patient in emergency department complaining of numbness to the right arm mainly when she wakes up in the middle of the night. She also is complaining of constant pain and intermittent weakness in the same arm. Exam is unremarkable at this time. Most likely there stasis at night from sticking papilla into arm pit. I advised her to stop putting a pillow under her arm. If there is disease and pain continues she will need to follow-up with neurology or orthopedic specialist. Her cervical spine is unremarkable, and there is no pain in her neck. There is no weakness on exam today. I do not think she needs any emergent imaging in emergency department. We'll discharge her home with prednisone, muscle relaxant, follow-up as needed.  Filed Vitals:   04/28/15 1118 04/28/15 1316  BP: 125/59 106/52  Pulse: 85 70  Temp: 98.3 F (36.8 C) 97.7 F (36.5 C)  TempSrc: Oral   Resp: 18 16  Height: 5\' 9"  (1.753 m)   Weight: 157 lb (71.215 kg)   SpO2: 100% 100%   I personally performed the services described in this documentation, which was scribed in my presence. The recorded information has been reviewed and is accurate.    Jaynie Crumbleatyana Harrington Jobe, PA-C 04/28/15 1435  Glynn OctaveStephen Rancour, MD 04/28/15 1753

## 2015-04-28 NOTE — ED Notes (Signed)
Declined W/C at D/C and was escorted to lobby by RN. 

## 2015-04-28 NOTE — ED Notes (Signed)
Pt presents to department for evaluation of R hand, arm and shoulder pain. Denies recent injury. Ongoing x1 week. 8/10 pain at the time. Pt is alert and oriented x4.

## 2015-05-23 ENCOUNTER — Ambulatory Visit: Payer: Medicaid Other | Attending: Physical Medicine and Rehabilitation

## 2015-06-12 ENCOUNTER — Ambulatory Visit: Payer: Medicaid Other | Attending: Physical Medicine and Rehabilitation | Admitting: Physical Therapy

## 2015-06-12 DIAGNOSIS — M62838 Other muscle spasm: Secondary | ICD-10-CM | POA: Diagnosis present

## 2015-06-12 DIAGNOSIS — M25511 Pain in right shoulder: Secondary | ICD-10-CM | POA: Insufficient documentation

## 2015-06-12 DIAGNOSIS — M25611 Stiffness of right shoulder, not elsewhere classified: Secondary | ICD-10-CM

## 2015-06-12 DIAGNOSIS — R293 Abnormal posture: Secondary | ICD-10-CM

## 2015-06-12 DIAGNOSIS — M542 Cervicalgia: Secondary | ICD-10-CM

## 2015-06-12 NOTE — Patient Instructions (Addendum)
   Laylana Gerwig PT, DPT, LAT, ATC  [facility] Phone: [phone]     

## 2015-06-12 NOTE — Therapy (Signed)
[facility] [address] [place], [place], [address] Phone: [phone]   Fax:  [phone]  Physical Therapy Evaluation  Patient Details  Name: Sabrina Griffin MRN: [id number] Date of Birth: 23-Dec-1985 Referring Provider:  Clinic, General Medical  Encounter Date: 06/12/2015      PT End of Session - 06/12/15 1503    Visit Number 1   Number of Visits 1   Date for PT Re-Evaluation 06/13/15   Authorization Type Medicaid   PT Start Time 1410   PT Stop Time 1500   PT Time Calculation (min) 50 min      Past Medical History  Diagnosis Date  . Heart murmur   . Diabetes mellitus     Past Surgical History  Procedure Laterality Date  . Dental surgery      There were no vitals filed for this visit.  Visit Diagnosis:  Cervical spine pain - Plan: PT plan of care cert/re-cert  Muscle spasm of right shoulder - Plan: PT plan of care cert/re-cert  Decreased right shoulder range of motion - Plan: PT plan of care cert/re-cert  Right shoulder pain - Plan: PT plan of care cert/re-cert  Abnormal posture - Plan: PT plan of care cert/re-cert      Subjective Assessment - 06/12/15 1412    Subjective pt is a 29 y.o F with CC of neck and R shoulder/arm pain that hase been going on for a while for the past 2 years. with intermittent numbness down the arm occurring 1 x every 2 - 3 months.  She reports constant burning and tingling in the arm.    Limitations Lifting;Walking;House hold activities   How long can you sit comfortably? unlimited   How long can you stand comfortably? unlimited   How long can you walk comfortably? unlimited   Diagnostic tests MRI  neck "C7 disc bulging" and decreased cervical lordosis    Patient Stated Goals to be pain free   Currently in Pain? Yes   Pain Score 10-Worst pain ever   Pain Location Neck   Pain Orientation Right   Pain Descriptors / Indicators Burning;Aching;Shooting   Pain Type Chronic pain    Pain Radiating Towards in the R shoulder   Pain Onset More than a month ago   Pain Frequency Constant   Aggravating Factors  lifting, carrying, turning head   Pain Relieving Factors nothing    Multiple Pain Sites Yes   Pain Score 10   Pain Location Arm   Pain Orientation Right   Pain Descriptors / Indicators Aching;Burning;Shooting;Throbbing   Pain Type Chronic pain   Pain Radiating Towards to the fingers   Pain Onset More than a month ago   Pain Frequency Constant   Aggravating Factors  moving, lifting, carrying   Pain Relieving Factors nothing            [facility] PT Assessment - 06/12/15 1420    Assessment   Medical Diagnosis Cervical spine/ and R shoulder pain   Onset Date/Surgical Date --  2 years   Hand Dominance Right   Next MD Visit 06/20/2015   Prior Therapy yes  bil knees   Precautions   Precautions None;Shoulder   Precaution Comments no reaching over head, and no lifting of 5/10#   Restrictions   Weight Bearing Restrictions No   Balance Screen   Has the patient fallen in the past 6 months Yes   How many times? 1   Has the patient had a  decrease in activity level because of a fear of falling?  No   Is the patient reluctant to leave their home because of a fear of falling?  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    Posture/Postural Control   Posture/Postural Control Postural limitations   Postural Limitations Rounded Shoulders;Forward head   ROM / Strength   AROM / PROM / Strength AROM;Strength   AROM   AROM Assessment Site Shoulder;Cervical   Right/Left Shoulder Right;Left   Right Shoulder Extension 48 Degrees   Right Shoulder Flexion 120 Degrees   Right Shoulder ABduction 100 Degrees   Right Shoulder Internal Rotation 82 Degrees   Right Shoulder External Rotation 66 Degrees   Left Shoulder Extension 62 Degrees   Left Shoulder Flexion 166 Degrees   Left Shoulder ABduction 162 Degrees   Left Shoulder Internal Rotation 80 Degrees   Left Shoulder External Rotation 82 Degrees   Cervical Flexion 52   Cervical Extension 45   Cervical - Right Side Bend 40   Cervical - Left Side Bend 28   Cervical - Right Rotation 38   Cervical - Left Rotation 60   Strength   Strength Assessment Site Shoulder   Right/Left Shoulder Right;Left   Right Shoulder Flexion 4-/5   Right Shoulder Extension 4/5   Right Shoulder ABduction 4/5   Right Shoulder Internal Rotation 4-/5   Right Shoulder External Rotation 4/5   Left Shoulder Flexion 4+/5   Left Shoulder Extension 4+/5   Left Shoulder ABduction 4/5   Left Shoulder Internal Rotation 4/5   Left Shoulder External Rotation 4/5   Palpation   Spinal mobility limited cervical mobiltiy from C3-T1 with pain upon palpation   Palpation comment  tenderness muscle belly of the supraspinatus, the sub-acromial bursa and inferior to the [facility] joint.    Special Tests    Special Tests Cervical;Rotator Cuff Impingement;Thoracic Outlet Syndrome   Cervical Tests Spurling's;Dictraction;other   Thoracic Outlet Syndrome  Adson Test;Allen Test   Rotator Cuff Impingment tests Neer impingement  test;Hawkins- Kennedy test;Painful Arc of Motion  scapular assist test   Spurling's   Findings Negative   Distraction Test   Findngs Negative   other    Comment ULTT of medial   Adson Test   Findings Negative   Allen Test   Findings Negative   Neer Impingement test    Findings Positive   Side Right   Hawkins-Kennedy test   Findings Positive   Side Right   Painful Arc of Motion   Findings Positive   Side Right                           PT Education - 06/12/15 1503  Education provided Yes   Education Details evaluation findings, anatomical explanation, HEP   Person(s) Educated Patient   Methods Explanation   Comprehension Verbalized understanding                    Plan - 06/12/15 1504    Clinical Impression Statement Trula OreChristina presents to OPPT with CC of R sided cervical pain and R shoulder pain that has been going on for 2 years. She demonstates limited cervical mobility with increased difficulty with L Rotation and R side bending, she demonstrates cervical intervertebral hypomobility with tenderness upon palpation at C3-T1. R shoulder demonstrates limited mobility in all planes compared bil  secondary to pain and muscle spasm. palpation reveals tenderness muscle belly of the supraspinatus, the sub-acromial bursa and [facility] to the [facility].  special testing was positive for hawkins-kennedy, neers impingement, and scapular assist indicating possible RC impingment, also demontrated possitive median nerve upper limb tension test. edcuated on HEP and provided shoulder stretching and strengthening and cervical mobilty.    Pt will benefit from skilled therapeutic intervention in order to improve on the following deficits Pain;Improper body mechanics;Postural dysfunction;Impaired UE functional use;Hypomobility;Decreased strength;Decreased range of motion;Decreased endurance;Decreased activity tolerance;Impaired flexibility;Decreased mobility   PT  Frequency 1x / week   PT Next Visit Plan Medicaid visit.   PT Home Exercise Plan see HEP handout   Consulted and Agree with Plan of Care Patient         Problem List There are no active problems to display for this patient.  Lulu RidingKristoffer Leamon PT, DPT, LAT, ATC  06/12/2015  7:08 PM    [facility] [address] [place], [place], [id number] Phone: [phone]   Fax:  [phone]

## 2015-06-20 ENCOUNTER — Other Ambulatory Visit: Payer: Self-pay | Admitting: Physical Medicine and Rehabilitation

## 2015-06-20 DIAGNOSIS — M25511 Pain in right shoulder: Secondary | ICD-10-CM

## 2015-06-30 ENCOUNTER — Other Ambulatory Visit: Payer: Medicaid Other

## 2016-02-22 ENCOUNTER — Emergency Department (HOSPITAL_COMMUNITY)
Admission: EM | Admit: 2016-02-22 | Discharge: 2016-02-23 | Disposition: A | Discharge status: Home / Self Care | Payer: Medicaid Other | Attending: Emergency Medicine | Admitting: Emergency Medicine

## 2016-02-22 ENCOUNTER — Emergency Department (HOSPITAL_COMMUNITY): Payer: Medicaid Other

## 2016-02-22 ENCOUNTER — Encounter (HOSPITAL_COMMUNITY): Payer: Self-pay

## 2016-02-22 DIAGNOSIS — Z3202 Encounter for pregnancy test, result negative: Secondary | ICD-10-CM | POA: Insufficient documentation

## 2016-02-22 DIAGNOSIS — Y9389 Activity, other specified: Secondary | ICD-10-CM | POA: Insufficient documentation

## 2016-02-22 DIAGNOSIS — E119 Type 2 diabetes mellitus without complications: Secondary | ICD-10-CM | POA: Insufficient documentation

## 2016-02-22 DIAGNOSIS — S3991XA Unspecified injury of abdomen, initial encounter: Secondary | ICD-10-CM | POA: Insufficient documentation

## 2016-02-22 DIAGNOSIS — S42002A Fracture of unspecified part of left clavicle, initial encounter for closed fracture: Secondary | ICD-10-CM | POA: Diagnosis not present

## 2016-02-22 DIAGNOSIS — Y998 Other external cause status: Secondary | ICD-10-CM | POA: Diagnosis not present

## 2016-02-22 DIAGNOSIS — R011 Cardiac murmur, unspecified: Secondary | ICD-10-CM | POA: Diagnosis not present

## 2016-02-22 DIAGNOSIS — Y92410 Unspecified street and highway as the place of occurrence of the external cause: Secondary | ICD-10-CM | POA: Diagnosis not present

## 2016-02-22 DIAGNOSIS — Z8781 Personal history of (healed) traumatic fracture: Secondary | ICD-10-CM

## 2016-02-22 DIAGNOSIS — F17210 Nicotine dependence, cigarettes, uncomplicated: Secondary | ICD-10-CM | POA: Diagnosis not present

## 2016-02-22 DIAGNOSIS — S0990XA Unspecified injury of head, initial encounter: Secondary | ICD-10-CM | POA: Insufficient documentation

## 2016-02-22 DIAGNOSIS — S29001A Unspecified injury of muscle and tendon of front wall of thorax, initial encounter: Secondary | ICD-10-CM | POA: Diagnosis not present

## 2016-02-22 DIAGNOSIS — S199XXA Unspecified injury of neck, initial encounter: Secondary | ICD-10-CM | POA: Diagnosis not present

## 2016-02-22 DIAGNOSIS — F419 Anxiety disorder, unspecified: Secondary | ICD-10-CM | POA: Insufficient documentation

## 2016-02-22 DIAGNOSIS — Z91040 Latex allergy status: Secondary | ICD-10-CM | POA: Diagnosis not present

## 2016-02-22 DIAGNOSIS — M542 Cervicalgia: Secondary | ICD-10-CM

## 2016-02-22 DIAGNOSIS — S4992XA Unspecified injury of left shoulder and upper arm, initial encounter: Secondary | ICD-10-CM | POA: Diagnosis present

## 2016-02-22 HISTORY — DX: Personal history of (healed) traumatic fracture: Z87.81

## 2016-02-22 MED ORDER — CYCLOBENZAPRINE HCL 5 MG PO TABS: 5.0000 mg | ORAL_TABLET | Freq: Every day | ORAL | Status: DC

## 2016-02-22 MED ORDER — MORPHINE SULFATE (PF) 4 MG/ML IV SOLN: 4.0000 mg | Freq: Once | INTRAVENOUS | Status: DC

## 2016-02-22 MED ORDER — MORPHINE SULFATE (PF) 4 MG/ML IV SOLN
4.0000 mg | Freq: Once | INTRAVENOUS | Status: AC
  Administered 2016-02-22: 4 mg via INTRAVENOUS
  Filled 2016-02-22: qty 1

## 2016-02-22 MED ORDER — NAPROXEN 500 MG PO TABS: 500.0000 mg | ORAL_TABLET | Freq: Two times a day (BID) | ORAL | Status: DC

## 2016-02-22 MED ORDER — HYDROCODONE-ACETAMINOPHEN 5-325 MG PO TABS: 1.0000 | ORAL_TABLET | Freq: Four times a day (QID) | ORAL | Status: DC | PRN
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
## 2016-02-22 NOTE — ED Notes (Signed)
Pregnancy test negative per mini lab
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
## 2016-02-22 NOTE — ED Notes (Signed)
Per lab pts pregnancy test is negative

## 2016-02-23 ENCOUNTER — Encounter (HOSPITAL_COMMUNITY): Payer: Self-pay | Admitting: Radiology

## 2016-02-23 ENCOUNTER — Emergency Department (HOSPITAL_COMMUNITY): Payer: Medicaid Other

## 2016-02-23 LAB — POC URINE PREG, ED: Preg Test, Ur: NEGATIVE

## 2016-02-23 MED ORDER — IOHEXOL 300 MG/ML  SOLN
100.0000 mL | Freq: Once | INTRAMUSCULAR | Status: AC | PRN
  Administered 2016-02-23: 100 mL via INTRAVENOUS

## 2016-02-23 NOTE — ED Notes (Signed)
Patient transported to CT 

## 2016-03-18 ENCOUNTER — Encounter: Payer: Self-pay | Admitting: *Deleted

## 2016-03-19 ENCOUNTER — Ambulatory Visit: Payer: Medicaid Other | Admitting: Diagnostic Neuroimaging

## 2016-03-27 ENCOUNTER — Ambulatory Visit (INDEPENDENT_AMBULATORY_CARE_PROVIDER_SITE_OTHER): Payer: Medicaid Other | Admitting: Diagnostic Neuroimaging

## 2016-03-27 ENCOUNTER — Encounter: Payer: Self-pay | Admitting: Diagnostic Neuroimaging

## 2016-03-27 VITALS — BP 126/58 | HR 92 | Resp 16 | Ht 68.0 in | Wt 171.0 lb

## 2016-03-27 DIAGNOSIS — M79602 Pain in left arm: Secondary | ICD-10-CM | POA: Diagnosis not present

## 2016-03-27 DIAGNOSIS — M542 Cervicalgia: Secondary | ICD-10-CM

## 2016-03-27 DIAGNOSIS — G44301 Post-traumatic headache, unspecified, intractable: Secondary | ICD-10-CM

## 2016-03-27 MED ORDER — AMITRIPTYLINE HCL 25 MG PO TABS: 25.0000 mg | ORAL_TABLET | Freq: Every day | ORAL | Status: DC

## 2016-03-27 NOTE — Patient Instructions (Signed)
Thank you for coming to see [place] at [facility]. I hope we have been able to provide you high quality care today.  You may receive a patient satisfaction survey over the next few weeks. We would appreciate your feedback and comments so that we may continue to improve ourselves and the health of our patients.  - start amitriptyline '25mg'$  at bedtime for headache prevention   ~~~~~~~~~~~~~~~~~~~~~~~~~~~~~~~~~~~~~~~~~~~~~~~~~~~~~~~~~~~~~~~~~  DR. Daelyn Mozer'S GUIDE TO HAPPY AND HEALTHY LIVING These are some of my general health and wellness recommendations. Some of them may apply to you better than others. Please use common sense as you try these suggestions and feel free to ask me any questions.   ACTIVITY/FITNESS Mental, social, emotional and physical stimulation are very important for brain and body health. Try learning a new activity (arts, music, language, sports, games).  Keep moving your body to the best of your abilities. You can do this at home, inside or outside, the park, community center, gym or anywhere you like. Consider a physical therapist or personal trainer to get started. Consider the app Sworkit. Fitness trackers such as smart-watches, smart-phones or Fitbits can help as well.   NUTRITION Eat more plants: colorful vegetables, nuts, seeds and berries.  Eat less sugar, salt, preservatives and processed foods.  Avoid toxins such as cigarettes and alcohol.  Drink water when you are thirsty. Warm water with a slice of lemon is an excellent morning drink to start the day.  Consider these websites for more information The Nutrition Source (https://www.henry-hernandez.biz/) Precision Nutrition (WindowBlog.ch)   RELAXATION Consider practicing mindfulness meditation or other relaxation techniques such as deep breathing, prayer, yoga, tai chi, massage. See website mindful.org or the apps Headspace or Calm to help get  started.   SLEEP Try to get at least 7-8+ hours sleep per day. Regular exercise and reduced caffeine will help you sleep better. Practice good sleep hygeine techniques. See website sleep.org for more information.   PLANNING Prepare estate planning, living will, healthcare POA documents. Sometimes this is best planned with the help of an attorney. Theconversationproject.org and agingwithdignity.org are excellent resources.
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
## 2016-04-10 ENCOUNTER — Other Ambulatory Visit: Payer: Self-pay | Admitting: Physical Medicine and Rehabilitation

## 2016-04-10 DIAGNOSIS — M542 Cervicalgia: Secondary | ICD-10-CM

## 2016-04-23 ENCOUNTER — Ambulatory Visit
Admission: RE | Admit: 2016-04-23 | Discharge: 2016-04-23 | Disposition: A | Discharge status: Home / Self Care | Payer: Medicaid Other | Source: Ambulatory Visit | Attending: Physical Medicine and Rehabilitation | Admitting: Physical Medicine and Rehabilitation

## 2016-04-23 DIAGNOSIS — M542 Cervicalgia: Secondary | ICD-10-CM

## 2016-04-24 ENCOUNTER — Other Ambulatory Visit: Payer: Self-pay | Admitting: Orthopedic Surgery

## 2016-04-24 DIAGNOSIS — M25531 Pain in right wrist: Secondary | ICD-10-CM

## 2016-05-02 ENCOUNTER — Ambulatory Visit
Admission: RE | Admit: 2016-05-02 | Discharge: 2016-05-02 | Disposition: A | Discharge status: Home / Self Care | Payer: Medicaid Other | Source: Ambulatory Visit | Attending: Orthopedic Surgery | Admitting: Orthopedic Surgery

## 2016-05-02 DIAGNOSIS — M25531 Pain in right wrist: Secondary | ICD-10-CM

## 2016-05-07 ENCOUNTER — Telehealth: Payer: Self-pay | Admitting: Diagnostic Neuroimaging

## 2016-05-08 NOTE — Telephone Encounter (Signed)
Records sent to [facility] dg

## 2016-05-19 ENCOUNTER — Inpatient Hospital Stay (HOSPITAL_COMMUNITY)
Admission: AD | Admit: 2016-05-19 | Discharge: 2016-05-19 | Discharge status: Against Medical Advice | Payer: Medicaid Other | Source: Ambulatory Visit | Attending: Obstetrics and Gynecology | Admitting: Obstetrics and Gynecology

## 2016-05-19 ENCOUNTER — Encounter (HOSPITAL_COMMUNITY): Payer: Self-pay | Admitting: *Deleted

## 2016-05-19 DIAGNOSIS — R1030 Lower abdominal pain, unspecified: Secondary | ICD-10-CM | POA: Diagnosis not present

## 2016-05-19 DIAGNOSIS — O210 Mild hyperemesis gravidarum: Secondary | ICD-10-CM | POA: Diagnosis present

## 2016-05-19 DIAGNOSIS — Z3A00 Weeks of gestation of pregnancy not specified: Secondary | ICD-10-CM | POA: Diagnosis not present

## 2016-05-19 LAB — URINE MICROSCOPIC-ADD ON: RBC / HPF: NONE SEEN RBC/hpf (ref 0–5)

## 2016-05-19 LAB — POCT PREGNANCY, URINE: PREG TEST UR: POSITIVE — AB

## 2016-05-19 LAB — URINALYSIS, ROUTINE W REFLEX MICROSCOPIC
BILIRUBIN URINE: NEGATIVE
GLUCOSE, UA: NEGATIVE mg/dL
Hgb urine dipstick: NEGATIVE
KETONES UR: NEGATIVE mg/dL
Nitrite: NEGATIVE
PH: 6.5 (ref 5.0–8.0)
PROTEIN: NEGATIVE mg/dL
SPECIFIC GRAVITY, URINE: 1.01 (ref 1.005–1.030)

## 2016-05-19 NOTE — MAU Note (Signed)
Urine sent to lab 
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
## 2016-05-19 NOTE — MAU Note (Signed)
Pt states she is upset about the wait.  Tia AlertPaige Grady RN in lobby speaking to pt.  Pt states she is unable to wait, signed AMA form.

## 2016-05-21 LAB — OB RESULTS CONSOLE GC/CHLAMYDIA
Chlamydia: NEGATIVE
GC PROBE AMP, GENITAL: NEGATIVE

## 2016-05-21 LAB — OB RESULTS CONSOLE HGB/HCT, BLOOD
HEMATOCRIT: 40 %
HEMOGLOBIN: 13.3 g/dL

## 2016-05-21 LAB — OB RESULTS CONSOLE PLATELET COUNT: PLATELETS: 323 10*3/uL

## 2016-05-22 ENCOUNTER — Ambulatory Visit: Payer: Medicaid Other | Admitting: Diagnostic Neuroimaging

## 2016-05-23 ENCOUNTER — Encounter: Payer: Self-pay | Admitting: Diagnostic Neuroimaging

## 2016-06-06 ENCOUNTER — Ambulatory Visit (INDEPENDENT_AMBULATORY_CARE_PROVIDER_SITE_OTHER): Payer: Medicaid Other | Admitting: Diagnostic Neuroimaging

## 2016-06-06 ENCOUNTER — Encounter: Payer: Self-pay | Admitting: Diagnostic Neuroimaging

## 2016-06-06 VITALS — BP 104/64 | HR 76 | Wt 170.0 lb

## 2016-06-06 DIAGNOSIS — G44301 Post-traumatic headache, unspecified, intractable: Secondary | ICD-10-CM
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
## 2016-06-11 ENCOUNTER — Encounter: Payer: Self-pay | Admitting: *Deleted

## 2016-06-11 DIAGNOSIS — Z3490 Encounter for supervision of normal pregnancy, unspecified, unspecified trimester: Secondary | ICD-10-CM

## 2016-06-11 DIAGNOSIS — O0990 Supervision of high risk pregnancy, unspecified, unspecified trimester: Secondary | ICD-10-CM | POA: Insufficient documentation

## 2016-06-12 ENCOUNTER — Encounter: Payer: Medicaid Other | Admitting: Obstetrics and Gynecology

## 2016-06-19 ENCOUNTER — Ambulatory Visit (INDEPENDENT_AMBULATORY_CARE_PROVIDER_SITE_OTHER): Payer: Medicaid Other | Admitting: Obstetrics and Gynecology

## 2016-06-19 ENCOUNTER — Encounter: Payer: Self-pay | Admitting: Obstetrics and Gynecology

## 2016-06-19 VITALS — BP 128/78 | HR 110 | Temp 98.4°F | Wt 167.6 lb

## 2016-06-19 DIAGNOSIS — R51 Headache: Secondary | ICD-10-CM

## 2016-06-19 DIAGNOSIS — O360110 Maternal care for anti-D [Rh] antibodies, first trimester, not applicable or unspecified: Secondary | ICD-10-CM

## 2016-06-19 DIAGNOSIS — Z1389 Encounter for screening for other disorder: Secondary | ICD-10-CM | POA: Diagnosis not present

## 2016-06-19 DIAGNOSIS — G44329 Chronic post-traumatic headache, not intractable: Secondary | ICD-10-CM

## 2016-06-19 DIAGNOSIS — Z3491 Encounter for supervision of normal pregnancy, unspecified, first trimester: Secondary | ICD-10-CM

## 2016-06-19 DIAGNOSIS — Z720 Tobacco use: Secondary | ICD-10-CM | POA: Insufficient documentation

## 2016-06-19 DIAGNOSIS — Z8751 Personal history of pre-term labor: Secondary | ICD-10-CM

## 2016-06-19 DIAGNOSIS — Z331 Pregnant state, incidental: Secondary | ICD-10-CM

## 2016-06-19 DIAGNOSIS — O24111 Pre-existing diabetes mellitus, type 2, in pregnancy, first trimester: Secondary | ICD-10-CM | POA: Diagnosis not present

## 2016-06-19 DIAGNOSIS — O0991 Supervision of high risk pregnancy, unspecified, first trimester: Secondary | ICD-10-CM

## 2016-06-19 DIAGNOSIS — R519 Headache, unspecified: Secondary | ICD-10-CM | POA: Insufficient documentation

## 2016-06-19 DIAGNOSIS — O24119 Pre-existing diabetes mellitus, type 2, in pregnancy, unspecified trimester: Secondary | ICD-10-CM | POA: Insufficient documentation

## 2016-06-19 LAB — POCT URINALYSIS DIPSTICK
Bilirubin, UA: NEGATIVE
Blood, UA: 50
Glucose, UA: NEGATIVE
KETONES UA: NEGATIVE
LEUKOCYTES UA: NEGATIVE
Nitrite, UA: NEGATIVE
PH UA: 5
PROTEIN UA: NEGATIVE
SPEC GRAV UA: 1.02
UROBILINOGEN UA: 0.2

## 2016-06-19 MED ORDER — GLUCOSE BLOOD VI STRP: ORAL_STRIP | Status: DC

## 2016-06-19 MED ORDER — BLOOD GLUCOSE MONITOR KIT: PACK | Status: DC

## 2016-06-19 MED ORDER — PROMETHAZINE HCL 12.5 MG PO TABS: 12.5000 mg | ORAL_TABLET | Freq: Four times a day (QID) | ORAL | Status: DC | PRN

## 2016-06-19 MED ORDER — ACCU-CHEK FASTCLIX LANCETS MISC: 1.0000 [IU] | Freq: Four times a day (QID) | Status: DC

## 2016-06-19 NOTE — Progress Notes (Signed)
Pt c/o frequent UTIs and yeast infections w/ pregnancies.

## 2016-06-20 DIAGNOSIS — O26899 Other specified pregnancy related conditions, unspecified trimester: Secondary | ICD-10-CM

## 2016-06-20 DIAGNOSIS — Z6791 Unspecified blood type, Rh negative: Secondary | ICD-10-CM | POA: Insufficient documentation
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
## 2016-06-21 LAB — PAP IG AND HPV HIGH-RISK
HPV, high-risk: NEGATIVE
PAP Smear Comment: 0

## 2016-06-21 LAB — CULTURE, OB URINE

## 2016-06-21 LAB — URINE CULTURE, OB REFLEX

## 2016-06-26 LAB — PRENATAL PROFILE I(LABCORP)
ANTIBODY SCREEN: NEGATIVE
Basophils Absolute: 0 10*3/uL (ref 0.0–0.2)
Basos: 0 %
EOS (ABSOLUTE): 0.1 10*3/uL (ref 0.0–0.4)
EOS: 1 %
Hematocrit: 37.7 % (ref 34.0–46.6)
Hemoglobin: 12.5 g/dL (ref 11.1–15.9)
Hepatitis B Surface Ag: NEGATIVE
IMMATURE GRANULOCYTES: 0 %
Immature Grans (Abs): 0 10*3/uL (ref 0.0–0.1)
LYMPHS ABS: 1.7 10*3/uL (ref 0.7–3.1)
Lymphs: 18 %
MCH: 29.6 pg (ref 26.6–33.0)
MCHC: 33.2 g/dL (ref 31.5–35.7)
MCV: 89 fL (ref 79–97)
Monocytes Absolute: 0.7 10*3/uL (ref 0.1–0.9)
Monocytes: 7 %
NEUTROS ABS: 7.4 10*3/uL — AB (ref 1.4–7.0)
NEUTROS PCT: 74 %
Platelets: 359 10*3/uL (ref 150–379)
RBC: 4.22 x10E6/uL (ref 3.77–5.28)
RDW: 13.3 % (ref 12.3–15.4)
RH TYPE: NEGATIVE
RPR Ser Ql: NONREACTIVE
RUBELLA: 1.7 {index} (ref >0.99)
WBC: 9.9 10*3/uL (ref 3.4–10.8)

## 2016-06-26 LAB — HEMOGLOBINOPATHY EVALUATION
HEMOGLOBIN A2 QUANTITATION: 2.7 % (ref 0.7–3.1)
HGB A: 97.3 % (ref 94.0–98.0)
HGB C: 0 %
HGB S: 0 %
Hemoglobin F Quantitation: 0 % (ref 0.0–2.0)

## 2016-06-26 LAB — DRUG SCREEN 764883 11+OXYCO+ALC+CRT-BUND
AMPHETAMINES, URINE: NEGATIVE ng/mL
BENZODIAZ UR QL: NEGATIVE ng/mL
Barbiturate: NEGATIVE ng/mL
CANNABINOID QUANT UR: NEGATIVE ng/mL
CREATININE: 228.2 mg/dL (ref 20.0–300.0)
Cocaine (Metabolite): NEGATIVE ng/mL
Ethanol: NEGATIVE %
MEPERIDINE: NEGATIVE ng/mL
Methadone Screen, Urine: NEGATIVE ng/mL
OPIATE SCREEN URINE: NEGATIVE ng/mL
Oxycodone/Oxymorphone, Urine: NEGATIVE ng/mL
PH OF URINE: 5.9 (ref 4.5–8.9)
PHENCYCLIDINE: NEGATIVE ng/mL
Propoxyphene: NEGATIVE ng/mL
TRAMADOL: NEGATIVE ng/mL

## 2016-06-26 LAB — CYSTIC FIBROSIS GENE TEST

## 2016-06-26 LAB — HIV ANTIBODY (ROUTINE TESTING W REFLEX): HIV Screen 4th Generation wRfx: NONREACTIVE

## 2016-06-26 LAB — HEMOGLOBIN A1C
ESTIMATED AVERAGE GLUCOSE: 97 mg/dL
Hgb A1c MFr Bld: 5 % (ref 4.8–5.6)

## 2016-07-02 ENCOUNTER — Ambulatory Visit (INDEPENDENT_AMBULATORY_CARE_PROVIDER_SITE_OTHER): Payer: Medicaid Other | Admitting: Family Medicine

## 2016-07-02 VITALS — BP 124/67 | HR 112 | Temp 98.0°F | Wt 167.0 lb

## 2016-07-02 DIAGNOSIS — O0991 Supervision of high risk pregnancy, unspecified, first trimester: Secondary | ICD-10-CM

## 2016-07-02 DIAGNOSIS — O24111 Pre-existing diabetes mellitus, type 2, in pregnancy, first trimester: Secondary | ICD-10-CM

## 2016-07-02 DIAGNOSIS — J0111 Acute recurrent frontal sinusitis: Secondary | ICD-10-CM | POA: Diagnosis not present

## 2016-07-02 MED ORDER — METFORMIN HCL 500 MG PO TABS: 500.0000 mg | ORAL_TABLET | Freq: Every day | ORAL | 3 refills | Status: DC

## 2016-07-02 MED ORDER — AMOXICILLIN-POT CLAVULANATE 875-125 MG PO TABS: 1.0000 | ORAL_TABLET | Freq: Two times a day (BID) | ORAL | 1 refills | Status: DC

## 2016-07-02 NOTE — Progress Notes (Signed)
Subjective:  Sabrina Griffin is a 30 y.o. [phone] at [redacted]w[redacted]d being seen today for ongoing prenatal care.  She is currently monitored for the following issues for this high-risk pregnancy and has Supervision of high risk pregnancy in first trimester; Pre-existing type 2 diabetes mellitus during pregnancy in first trimester (HCC); Chronic headache; History of preterm delivery; Tobacco abuse; and Rh negative state in antepartum period on her problem list.  GDM: Patient just started testing..  Reports no hypoglycemic episodes.  Tolerating medication well Fasting: 87-96, most elevated 2hr PP: only tested a few times as she was in [place] visiting family.  Patient reports sinus pain x 2-3 week with purulent drainage.  No fevers.  Worsening..   Sabrina Griffin. Bleeding: None.  Movement: Absent. Denies leaking of fluid.   The following portions of the patient's history were reviewed and updated as appropriate: allergies, current medications, past family history, past medical history, past social history, past surgical history and problem list. Problem list updated.  Objective:   Vitals:   07/02/16 1349  BP: 124/67  Pulse: (!) 112  Temp: 98 F (36.7 C)  Weight: 167 lb (75.8 kg)    Fetal Status: Fetal Heart Rate (bpm): 140   Movement: Absent     General:  Alert, oriented and cooperative. Patient is in no acute distress.  Skin: Skin is warm and dry. No rash noted.   Cardiovascular: Normal heart rate noted  Respiratory: Normal respiratory effort, no problems with respiration noted  Abdomen: Soft, gravid, appropriate for gestational age. Pain/Pressure: Present     Pelvic: Vag. Bleeding: None     Cervical exam deferred        Extremities: Normal range of motion.  Edema: None  Mental Status: Normal mood and affect. Normal behavior. Normal judgment and thought content.   Urinalysis: Urine Protein: Negative Urine Glucose: Negative  Assessment and Plan:  Pregnancy: [id number] at [redacted]w[redacted]d  1. Supervision of high  risk pregnancy in first trimester FHT normal  2. Pre-existing type 2 diabetes mellitus during pregnancy in first trimester (HCC) Start metformin 500mg  QHS.  Continue testing.  RTC in 2 weeks.  3.  Sinusitis. Augmentin 875mg  BID.    Preterm labor symptoms and general obstetric precautions including but not limited to vaginal bleeding, contractions, leaking of fluid and fetal movement were reviewed in detail with the patient. Please refer to After Visit Summary for other counseling recommendations.  No Follow-up on file.   Levie Heritage, DO

## 2016-07-16 ENCOUNTER — Ambulatory Visit (INDEPENDENT_AMBULATORY_CARE_PROVIDER_SITE_OTHER): Payer: Medicaid Other | Admitting: Obstetrics and Gynecology

## 2016-07-16 ENCOUNTER — Encounter: Payer: Self-pay | Admitting: Obstetrics and Gynecology

## 2016-07-16 VITALS — BP 120/61 | HR 98 | Wt 167.0 lb

## 2016-07-16 DIAGNOSIS — Z720 Tobacco use: Secondary | ICD-10-CM

## 2016-07-16 DIAGNOSIS — O360120 Maternal care for anti-D [Rh] antibodies, second trimester, not applicable or unspecified: Secondary | ICD-10-CM | POA: Diagnosis not present

## 2016-07-16 DIAGNOSIS — Z8751 Personal history of pre-term labor: Secondary | ICD-10-CM

## 2016-07-16 DIAGNOSIS — O24111 Pre-existing diabetes mellitus, type 2, in pregnancy, first trimester: Secondary | ICD-10-CM

## 2016-07-16 DIAGNOSIS — G44329 Chronic post-traumatic headache, not intractable: Secondary | ICD-10-CM

## 2016-07-16 DIAGNOSIS — Z3492 Encounter for supervision of normal pregnancy, unspecified, second trimester: Secondary | ICD-10-CM

## 2016-07-16 DIAGNOSIS — O0991 Supervision of high risk pregnancy, unspecified, first trimester: Secondary | ICD-10-CM

## 2016-07-16 MED ORDER — FLUCONAZOLE 150 MG PO TABS: 150.0000 mg | ORAL_TABLET | Freq: Once | ORAL | 0 refills | Status: AC

## 2016-07-16 MED ORDER — PRENATAL VITAMINS 0.8 MG PO TABS: 1.0000 | ORAL_TABLET | Freq: Every day | ORAL | 12 refills | Status: DC

## 2016-07-16 MED ORDER — ZOLPIDEM TARTRATE 5 MG PO TABS: 5.0000 mg | ORAL_TABLET | Freq: Every evening | ORAL | 3 refills | Status: DC | PRN

## 2016-07-16 NOTE — Progress Notes (Addendum)
Subjective:  Sabrina Griffin is a 30 y.o. [id number] at 7974w6d being seen today for ongoing prenatal care.  She is currently monitored for the following issues for this high-risk pregnancy and has Supervision of high risk pregnancy in first trimester; Pre-existing type 2 diabetes mellitus during pregnancy in first trimester (HCC); Chronic headache; History of preterm delivery; Tobacco abuse; and Rh negative state in antepartum period on her problem list.  Patient reports insomnia and RLQ pain. She states this pain was present prior to pregnancy . Patient also reports having a yeast infection following Augmentin course. She only wants the rx and does not want to be checked  Contractions: Not present. Vag. Bleeding: None.   . Denies leaking of fluid.   The following portions of the patient's history were reviewed and updated as appropriate: allergies, current medications, past family history, past medical history, past social history, past surgical history and problem list. Problem list updated.  Objective:   Vitals:   07/16/16 1130  BP: 120/61  Pulse: 98  Weight: 167 lb (75.8 kg)    Fetal Status: Fetal Heart Rate (bpm): 147         General:  Alert, oriented and cooperative. Patient is in no acute distress.  Skin: Skin is warm and dry. No rash noted.   Cardiovascular: Normal heart rate noted  Respiratory: Normal respiratory effort, no problems with respiration noted  Abdomen: Soft, gravid, appropriate for gestational age. Pain/Pressure: Present     Pelvic:  Cervical exam deferred        Extremities: Normal range of motion.  Edema: None  Mental Status: Normal mood and affect. Normal behavior. Normal judgment and thought content.   Urinalysis: Urine Protein: Negative Urine Glucose: Negative  Assessment and Plan:  Pregnancy: [id number] at 3774w6d  1. Prenatal care, second trimester - POCT Urinalysis Dipstick  2. History of preterm delivery Patient is still undecided at this time regarding  17-P. She had 2 full term pregnancies following her preterm birth  283. Pre-existing type 2 diabetes mellitus during pregnancy in first trimester (HCC) CBGs reviewed and great majority within range. 1 value of 129 following frosted flakes Continue daily metformin  4. Supervision of high risk pregnancy in first trimester Anatomy ultrasound ordered Discuss quad screen at next visit. Patient declined first screen Rx ambien provided Rx diflucan provided. Informed patient that if symptoms persists, testing will need to be done Patient lives in [place] and is considering transferring care to see Dr. Adrian BlackwaterStinson - US MFM OB COMP + 14 WK; Future  5. Rh negative state in antepartum period, second trimester, not applicable or unspecified fetus Rhogam at 28 weeks  6. Tobacco abuse Discussed smoking cessation strategies  7. Chronic post-traumatic headache, not intractable   General obstetric precautions including but not limited to vaginal bleeding, contractions, leaking of fluid and fetal movement were reviewed in detail with the patient. Please refer to After Visit Summary for other counseling recommendations.  Return in about 4 weeks (around 08/13/2016).   Catalina AntiguaPeggy Shlok Raz, MD

## 2016-08-06 ENCOUNTER — Encounter (HOSPITAL_COMMUNITY): Payer: Self-pay | Admitting: Obstetrics and Gynecology

## 2016-08-13 ENCOUNTER — Ambulatory Visit (HOSPITAL_COMMUNITY)
Admission: RE | Admit: 2016-08-13 | Discharge: 2016-08-13 | Disposition: A | Discharge status: Home / Self Care | Payer: Medicaid Other | Source: Ambulatory Visit | Attending: Obstetrics and Gynecology | Admitting: Obstetrics and Gynecology

## 2016-08-13 ENCOUNTER — Other Ambulatory Visit: Payer: Self-pay | Admitting: Obstetrics and Gynecology

## 2016-08-13 ENCOUNTER — Ambulatory Visit (INDEPENDENT_AMBULATORY_CARE_PROVIDER_SITE_OTHER): Payer: Medicaid Other | Admitting: Obstetrics and Gynecology

## 2016-08-13 ENCOUNTER — Encounter (HOSPITAL_COMMUNITY): Payer: Self-pay

## 2016-08-13 VITALS — BP 122/64 | HR 99 | Wt 170.0 lb

## 2016-08-13 VITALS — BP 120/76 | HR 97 | Wt 169.0 lb

## 2016-08-13 DIAGNOSIS — O24312 Unspecified pre-existing diabetes mellitus in pregnancy, second trimester: Secondary | ICD-10-CM

## 2016-08-13 DIAGNOSIS — Z720 Tobacco use: Secondary | ICD-10-CM

## 2016-08-13 DIAGNOSIS — O360110 Maternal care for anti-D [Rh] antibodies, first trimester, not applicable or unspecified: Secondary | ICD-10-CM | POA: Diagnosis not present

## 2016-08-13 DIAGNOSIS — O99332 Smoking (tobacco) complicating pregnancy, second trimester: Secondary | ICD-10-CM

## 2016-08-13 DIAGNOSIS — Z6791 Unspecified blood type, Rh negative: Secondary | ICD-10-CM | POA: Insufficient documentation

## 2016-08-13 DIAGNOSIS — Z36 Encounter for antenatal screening of mother: Secondary | ICD-10-CM | POA: Diagnosis not present

## 2016-08-13 DIAGNOSIS — O09212 Supervision of pregnancy with history of pre-term labor, second trimester: Secondary | ICD-10-CM | POA: Diagnosis not present

## 2016-08-13 DIAGNOSIS — Z3A19 19 weeks gestation of pregnancy: Secondary | ICD-10-CM | POA: Insufficient documentation

## 2016-08-13 DIAGNOSIS — O360120 Maternal care for anti-D [Rh] antibodies, second trimester, not applicable or unspecified: Secondary | ICD-10-CM

## 2016-08-13 DIAGNOSIS — O24111 Pre-existing diabetes mellitus, type 2, in pregnancy, first trimester: Secondary | ICD-10-CM

## 2016-08-13 DIAGNOSIS — O3432 Maternal care for cervical incompetence, second trimester: Secondary | ICD-10-CM

## 2016-08-13 DIAGNOSIS — Z1389 Encounter for screening for other disorder: Secondary | ICD-10-CM

## 2016-08-13 DIAGNOSIS — Z8751 Personal history of pre-term labor: Secondary | ICD-10-CM

## 2016-08-13 DIAGNOSIS — O0991 Supervision of high risk pregnancy, unspecified, first trimester: Secondary | ICD-10-CM

## 2016-08-13 DIAGNOSIS — O26892 Other specified pregnancy related conditions, second trimester: Secondary | ICD-10-CM | POA: Insufficient documentation

## 2016-08-13 NOTE — Patient Instructions (Signed)

## 2016-08-13 NOTE — Progress Notes (Signed)
Subjective:  Sabrina Griffin is a 30 y.o. [id number] at 7138w6d being seen today for ongoing prenatal care.  She is currently monitored for the following issues for this high-risk pregnancy and has Supervision of high risk pregnancy in first trimester; Pre-existing type 2 diabetes mellitus during pregnancy in first trimester (HCC); Chronic headache; History of preterm delivery; Tobacco abuse; and Rh negative state in antepartum period on her problem list.  Patient reports no complaints.  Contractions: Not present. Vag. Bleeding: None.  Movement: Present. Denies leaking of fluid.   The following portions of the patient's history were reviewed and updated as appropriate: allergies, current medications, past family history, past medical history, past social history, past surgical history and problem list. Problem list updated.  Objective:   Vitals:   08/13/16 0926  BP: 120/76  Pulse: 97  Weight: 169 lb (76.7 kg)    Fetal Status: Fetal Heart Rate (bpm): 150   Movement: Present     General:  Alert, oriented and cooperative. Patient is in no acute distress.  Skin: Skin is warm and dry. No rash noted.   Cardiovascular: Normal heart rate noted  Respiratory: Normal respiratory effort, no problems with respiration noted  Abdomen: Soft, gravid, appropriate for gestational age. Pain/Pressure: Present     Pelvic:  Cervical exam deferred        Extremities: Normal range of motion.  Edema: None  Mental Status: Normal mood and affect. Normal behavior. Normal judgment and thought content.   Urinalysis: Urine Protein: Negative Urine Glucose: Negative  Assessment and Plan:  Pregnancy: [id number] at 9138w6d  1. Pre-existing type 2 diabetes mellitus during pregnancy in first trimester (HCC) BS are in goal range  2. Supervision of high risk pregnancy in first trimester   3. Rh negative state in antepartum period, first trimester, not applicable or unspecified fetus  4. H/O preterm delivery with 2 term  delivery afterwards. Remains undecided about 17 OHP  5. Smoker  Advised to quit   Preterm labor symptoms and general obstetric precautions including but not limited to vaginal bleeding, contractions, leaking of fluid and fetal movement were reviewed in detail with the patient. Please refer to After Visit Summary for other counseling recommendations.  No Follow-up on file.   Hermina StaggersMichael L Keishana Klinger, MD

## 2016-08-26 ENCOUNTER — Encounter (HOSPITAL_COMMUNITY): Payer: Self-pay

## 2016-08-27 ENCOUNTER — Other Ambulatory Visit (HOSPITAL_COMMUNITY): Payer: Self-pay | Admitting: Maternal and Fetal Medicine

## 2016-08-27 ENCOUNTER — Ambulatory Visit (HOSPITAL_COMMUNITY)
Admission: RE | Admit: 2016-08-27 | Discharge: 2016-08-27 | Disposition: A | Discharge status: Home / Self Care | Payer: Medicaid Other | Source: Ambulatory Visit | Attending: Obstetrics and Gynecology | Admitting: Obstetrics and Gynecology

## 2016-08-27 ENCOUNTER — Encounter (HOSPITAL_COMMUNITY): Payer: Self-pay

## 2016-08-27 DIAGNOSIS — O24312 Unspecified pre-existing diabetes mellitus in pregnancy, second trimester: Secondary | ICD-10-CM

## 2016-08-27 DIAGNOSIS — O24112 Pre-existing diabetes mellitus, type 2, in pregnancy, second trimester: Secondary | ICD-10-CM | POA: Insufficient documentation

## 2016-08-27 DIAGNOSIS — O360120 Maternal care for anti-D [Rh] antibodies, second trimester, not applicable or unspecified: Secondary | ICD-10-CM

## 2016-08-27 DIAGNOSIS — Z6791 Unspecified blood type, Rh negative: Secondary | ICD-10-CM | POA: Diagnosis not present

## 2016-08-27 DIAGNOSIS — O99332 Smoking (tobacco) complicating pregnancy, second trimester: Secondary | ICD-10-CM | POA: Diagnosis not present

## 2016-08-27 DIAGNOSIS — O24111 Pre-existing diabetes mellitus, type 2, in pregnancy, first trimester: Secondary | ICD-10-CM

## 2016-08-27 DIAGNOSIS — Z3A20 20 weeks gestation of pregnancy: Secondary | ICD-10-CM

## 2016-08-27 DIAGNOSIS — Z8751 Personal history of pre-term labor: Secondary | ICD-10-CM

## 2016-08-27 DIAGNOSIS — O09212 Supervision of pregnancy with history of pre-term labor, second trimester: Secondary | ICD-10-CM | POA: Insufficient documentation

## 2016-08-27 DIAGNOSIS — O26892 Other specified pregnancy related conditions, second trimester: Secondary | ICD-10-CM | POA: Insufficient documentation

## 2016-09-10 ENCOUNTER — Ambulatory Visit (HOSPITAL_COMMUNITY)
Admission: RE | Admit: 2016-09-10 | Discharge: 2016-09-10 | Disposition: A | Discharge status: Home / Self Care | Payer: Medicaid Other | Source: Ambulatory Visit | Attending: Obstetrics and Gynecology | Admitting: Obstetrics and Gynecology

## 2016-09-10 ENCOUNTER — Other Ambulatory Visit (HOSPITAL_COMMUNITY): Payer: Self-pay | Admitting: *Deleted

## 2016-09-10 ENCOUNTER — Encounter (HOSPITAL_COMMUNITY): Payer: Self-pay

## 2016-09-10 ENCOUNTER — Other Ambulatory Visit (HOSPITAL_COMMUNITY): Payer: Self-pay | Admitting: Maternal and Fetal Medicine

## 2016-09-10 DIAGNOSIS — O24415 Gestational diabetes mellitus in pregnancy, controlled by oral hypoglycemic drugs: Secondary | ICD-10-CM

## 2016-09-10 DIAGNOSIS — O99332 Smoking (tobacco) complicating pregnancy, second trimester: Secondary | ICD-10-CM | POA: Diagnosis not present

## 2016-09-10 DIAGNOSIS — Z6791 Unspecified blood type, Rh negative: Secondary | ICD-10-CM | POA: Diagnosis not present

## 2016-09-10 DIAGNOSIS — Z3A22 22 weeks gestation of pregnancy: Secondary | ICD-10-CM | POA: Insufficient documentation

## 2016-09-10 DIAGNOSIS — O26892 Other specified pregnancy related conditions, second trimester: Secondary | ICD-10-CM | POA: Insufficient documentation

## 2016-09-10 DIAGNOSIS — Z8751 Personal history of pre-term labor: Secondary | ICD-10-CM

## 2016-09-10 DIAGNOSIS — O24111 Pre-existing diabetes mellitus, type 2, in pregnancy, first trimester: Secondary | ICD-10-CM

## 2016-09-10 DIAGNOSIS — O24312 Unspecified pre-existing diabetes mellitus in pregnancy, second trimester: Secondary | ICD-10-CM

## 2016-09-10 DIAGNOSIS — O09292 Supervision of pregnancy with other poor reproductive or obstetric history, second trimester: Secondary | ICD-10-CM | POA: Insufficient documentation

## 2016-09-10 DIAGNOSIS — O24112 Pre-existing diabetes mellitus, type 2, in pregnancy, second trimester: Secondary | ICD-10-CM | POA: Insufficient documentation

## 2016-09-12 ENCOUNTER — Encounter: Payer: Medicaid Other | Admitting: Family Medicine

## 2016-09-16 ENCOUNTER — Ambulatory Visit (INDEPENDENT_AMBULATORY_CARE_PROVIDER_SITE_OTHER): Payer: Medicaid Other | Admitting: Obstetrics & Gynecology

## 2016-09-16 VITALS — BP 123/57 | HR 99 | Wt 174.0 lb

## 2016-09-16 DIAGNOSIS — O24112 Pre-existing diabetes mellitus, type 2, in pregnancy, second trimester: Secondary | ICD-10-CM

## 2016-09-16 DIAGNOSIS — Z720 Tobacco use: Secondary | ICD-10-CM

## 2016-09-16 DIAGNOSIS — O24111 Pre-existing diabetes mellitus, type 2, in pregnancy, first trimester: Secondary | ICD-10-CM

## 2016-09-16 DIAGNOSIS — Z8751 Personal history of pre-term labor: Secondary | ICD-10-CM

## 2016-09-16 DIAGNOSIS — Z3490 Encounter for supervision of normal pregnancy, unspecified, unspecified trimester: Secondary | ICD-10-CM

## 2016-09-16 DIAGNOSIS — O0991 Supervision of high risk pregnancy, unspecified, first trimester: Secondary | ICD-10-CM

## 2016-09-16 DIAGNOSIS — O26899 Other specified pregnancy related conditions, unspecified trimester: Secondary | ICD-10-CM

## 2016-09-16 DIAGNOSIS — Z6791 Unspecified blood type, Rh negative: Secondary | ICD-10-CM

## 2016-09-16 MED ORDER — METFORMIN HCL 500 MG PO TABS: 500.0000 mg | ORAL_TABLET | Freq: Every day | ORAL | 3 refills | Status: DC

## 2016-09-16 NOTE — Patient Instructions (Signed)
Type 1 or Type 2 Diabetes Mellitus During Pregnancy Diabetes mellitus, often simply referred to as diabetes, is a long-term (chronic) disease. Type 1 diabetes occurs when the islet cells, which are in the pancreas and make the hormone insulin, are destroyed and can no longer make insulin. Type 2 diabetes occurs when the pancreas does not make enough insulin, the cells are less responsive to the insulin that is made (insulin resistance), or both. Insulin is needed to move sugars from food into the tissue cells. The tissue cells use the sugars for energy. The lack of insulin or the lack of normal response to insulin causes excess sugars to build up in the blood instead of going into the tissue cells. As a result, high blood sugar (hyperglycemia) develops.  If blood glucose levels are kept in the normal range both before and during pregnancy, women can have a healthy pregnancy. If your blood glucose levels are not well controlled, there may be risks to you, your unborn baby, and your labor and delivery. Also, there may be risks to your baby once he or she is born.  RISK FACTORS  You are predisposed to developing type 1 diabetes if someone in your family has diabetes and you are exposed to certain environmental triggers.  You have an increased chance of developing type 2 diabetes if you have a family history of diabetes and also have one or more of the following risk factors:  Being overweight.  Having an inactive lifestyle.  Having a history of consistently eating high-calorie foods. SYMPTOMS  Increased thirst (polydipsia).  Increased urination (polyuria).  Increased urination during the night (nocturia).  Weight loss. This weight loss may be rapid.  Frequent, recurring infections.  Tiredness (fatigue).  Weakness.  Vision changes, such as blurred vision.  Fruity smell to your breath.  Abdominal pain.  Nausea or vomiting. DIAGNOSIS  If you have risk factors for diabetes, you may  be screened for undiagnosed type 2 diabetes at your first prenatal visit. If you have previously given birth and you had gestational diabetes, you should be screened. The screening should be performed 6-12 weeks after the child is born and repeated every 1-3 years after the first test. Diabetes is diagnosed when blood glucose levels are increased. Your blood glucose level may be checked by one or more of the following blood tests:  A fasting blood glucose test. You will not be allowed to eat for at least 8 hours before a blood sample is taken.  A random blood glucose test. Your blood glucose is checked at any time of the day regardless of when you ate.  A hemoglobin A1c blood glucose test. A hemoglobin A1c test provides information about blood glucose control over the previous 3 months.  An oral glucose tolerance test (OGTT). Your blood glucose is measured after you have not eaten (fasted) for 1-3 hours and then after you drink a glucose-containing beverage. An OGTT is usually performed during weeks 24-28 of your pregnancy. TREATMENT   You will need to take diabetes medicine or insulin daily to keep blood glucose levels in the desired range.  You will need to match insulin dosing with exercise and healthy food choices. If you have type 1 or type 2 diabetes, your treatment goal is to maintain the following blood glucose levels during pregnancy:  Before meals (preprandial), at bedtime, and overnight: 60-99 mg/dL.  After meals (postprandial): peak of 100-129 mg/dL.  A1c: less than 7%. HOME CARE INSTRUCTIONS   Have your hemoglobin   A1c level checked twice a year.  Perform daily blood glucose monitoring as directed by your health care provider. It is common to perform frequent blood glucose monitoring.  Monitor urine ketones when you are sick and as directed by your health care provider.  Take your diabetes medicine and insulin as directed by your health care provider to maintain your  blood glucose level in the desired range.  Never run out of diabetes medicine or insulin. It is needed every day.  Adjust insulin based on your intake of carbohydrates. Carbohydrates can raise blood glucose levels but need to be included in your diet. Carbohydrates provide vitamins, minerals, and fiber, which are an essential part of a healthy diet. Carbohydrates are found in fruits, vegetables, whole grains, dairy products, legumes, and foods containing added sugars.  Eat healthy foods. Alternate 3 meals with 3 snacks.  Maintain a healthy weight gain. The usual total expected weight gain varies according to your prepregnancy body mass index (BMI).  Carry a medical alert card or wear medical alert jewelry.  Carry a 15-gram carbohydrate snack with you at all times to treat low blood sugar (hypoglycemia). Some examples of 15-gram carbohydrate snacks include:  Glucose tablets, 3 or 4.  Glucose gel, 15-gram tube.  Raisins, 2 Tbsp (24 grams).  Jelly beans, 6.  Animal crackers, 8.  Fruit juice, regular soda, or low-fat milk, 4 ounces (120 mL).  Gummy treats, 9.  Recognize hypoglycemia. Hypoglycemia during pregnancy occurs with blood glucose levels of 60 mg/dL and below. The risk for hypoglycemia increases when fasting or skipping meals, during or after intense exercise, and during sleep. Hypoglycemia symptoms can include:  Tremors or shakes.  Decreased ability to concentrate.  Sweating.  Increased heart rate.  Headache.  Dry mouth.  Hunger.  Irritability.  Anxiety.  Restless sleep.  Altered speech or coordination.  Confusion.  Treat hypoglycemia promptly. If you are alert and able to safely swallow, follow the 15:15 rule:  Take 15-20 grams of rapid-acting glucose or carbohydrate. Rapid-acting options include glucose gel, glucose tablets, or 4 ounces (120 mL) of fruit juice, regular soda, or low-fat milk.  Check your blood glucose level 15 minutes after taking the  glucose.  Take an additional 15-20 grams of glucose if the repeat blood glucose level is still 70 mg/dL or below.  Eat a meal or snack within 1 hour once blood glucose levels return to normal.  Engage in at least 30 minutes of physical activity a day or as directed by your health care provider. Ten minutes of physical activity timed 30 minutes after each meal is encouraged to control postprandial blood glucose levels.  Watch for polyuria (excess urination) and polydipsia (feeling extra thirsty), which are early signs of hyperglycemia. An early awareness of hyperglycemia allows for prompt treatment. Treat hyperglycemia as directed by your health care provider.  Adjust your insulin dosing and food intake, as needed, if you start a new exercise or sport.  Follow your sick-day plan any time you are unable to eat or drink as usual.  Avoid tobacco and alcohol use.  Keep all follow-up visits as directed by your health care provider.  Follow the advice of your health care provider regarding your prenatal and post-delivery (postpartum) appointments, meal planning, exercise, medicines, vitamins, blood tests, other medical tests, and physical activities.  Continue daily skin and foot care. Examine your skin and feet daily for cuts, bruises, redness, nail problems, bleeding, blisters, or sores. A foot exam by a health care provider should   be done annually.  Brush your teeth and gums at least twice a day and floss at least once a day. Follow up with your dentist regularly.  Schedule an eye exam during the first trimester of your pregnancy or as directed by your health care provider.  Share your diabetes management plan with your workplace or school.  Stay up-to-date with immunizations.  Learn to manage stress.  Obtain ongoing diabetes education and support as needed.  Your health care provider may recommend that you take one low-dose aspirin (81 mg) each day to help prevent high blood pressure  during your pregnancy (preeclampsia or eclampsia). You may be at risk for preeclampsia or eclampsia if:  You had preeclampsia or eclampsia during a previous pregnancy.  Your baby did not grow as expected during a previous pregnancy.  You experienced preterm birth with a previous pregnancy.  You experienced a separation of the placenta from the uterus (placental abruption) during a previous pregnancy.  You experienced the loss of your baby during a previous pregnancy.  You are pregnant with more than one baby.  You have other medical conditions, such as high blood pressure or autoimmune disease. SEEK MEDICAL CARE IF:   You are unable to eat food or drink fluids for more than 6 hours.  You have nausea and vomiting for more than 6 hours.  You have a blood glucose level of 200 mg/dL and you have ketones in your urine.  There is a change in mental status.  You develop vision problems.  You have a persistent headache.  You have upper abdominal pain or discomfort.  You have an additional serious sickness.  You have diarrhea for more than 6 hours.  You have been sick or have had a fever for 2 days and are not getting better. SEEK IMMEDIATE MEDICAL CARE IF:  You have difficulty breathing.  You no longer feel your baby moving.  You are bleeding or have discharge from your vagina.  You start having premature contractions or labor. MAKE SURE YOU:  Understand these instructions.  Will watch your condition.  Will get help right away if you are not doing well or get worse.   This information is not intended to replace advice given to you by your health care provider. Make sure you discuss any questions you have with your health care provider.   Document Released: 08/12/2012 Document Revised: 12/09/2014 Document Reviewed: 08/12/2012 Elsevier Interactive Patient Education 2016 [employer]  

## 2016-09-16 NOTE — Progress Notes (Signed)
   PRENATAL VISIT NOTE  Subjective:  Sabrina Griffin is a 30 y.o. [phone] at 3554w5d being seen today for ongoing prenatal care.  She is currently monitored for the following issues for this high-risk pregnancy and has Supervision of high risk pregnancy in first trimester; Pre-existing type 2 diabetes mellitus during pregnancy in first trimester; Chronic headache; History of preterm delivery; Tobacco abuse; and Rh negative state in antepartum period on her problem list.  Patient reports no complaints.  Contractions: Not present. Vag. Bleeding: None.  Movement: Present. Denies leaking of fluid.   The following portions of the patient's history were reviewed and updated as appropriate: allergies, current medications, past family history, past medical history, past social history, past surgical history and problem list. Problem list updated.  Objective:   Vitals:   09/16/16 1048  BP: (!) 123/57  Pulse: 99  Weight: 174 lb (78.9 kg)    Fetal Status: Fetal Heart Rate (bpm): 140   Movement: Present     General:  Alert, oriented and cooperative. Patient is in no acute distress.  Skin: Skin is warm and dry. No rash noted.   Cardiovascular: Normal heart rate noted  Respiratory: Normal respiratory effort, no problems with respiration noted  Abdomen: Soft, gravid, appropriate for gestational age. Pain/Pressure: Present     Pelvic:  Cervical exam deferred        Extremities: Normal range of motion.  Edema: None  Mental Status: Normal mood and affect. Normal behavior. Normal judgment and thought content.   Assessment and Plan:  Pregnancy: [id number] at 854w5d  1. Prenatal care, antepartum  2. Supervision of high risk pregnancy in first trimester  3. Tobacco abuse  4. Rh negative state in antepartum period Needs rhogam at 28 weeks.  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   5. Pre-existing type 2 diabetes mellitus during pregnancy in first trimester Pt has not kept a glucose log.  She reports that no one  told her that she needed it.  She reports that her glucose is normal and she checks it daily.  She reports that it is normal but, does not have any levels.   Pt does not have lancets currently but will call Victorino DikeJennifer with the name of her glucometer to get refills on strips.  Pt given copies of blank logs to monitor.   6. History of preterm delivery Pt has been getting cervical length.  Her last US was 4cm. Pt declined 17 OH-P Reports that she does not believe there will be a problem since her cervix isn't short and she had 2 subsequent term deliveries.     Preterm labor symptoms and general obstetric precautions including but not limited to vaginal bleeding, contractions, leaking of fluid and fetal movement were reviewed in detail with the patient. Please refer to After Visit Summary for other counseling recommendations.  Return in about 4 weeks (around 10/14/2016).  Willodean Rosenthalarolyn Harraway-Smith, MD

## 2016-09-19 ENCOUNTER — Encounter: Payer: Self-pay | Admitting: Obstetrics and Gynecology

## 2016-09-19 ENCOUNTER — Other Ambulatory Visit: Payer: Self-pay

## 2016-09-19 DIAGNOSIS — E119 Type 2 diabetes mellitus without complications: Secondary | ICD-10-CM | POA: Insufficient documentation

## 2016-09-19 MED ORDER — ACCU-CHEK FASTCLIX LANCETS MISC: 1.0000 [IU] | Freq: Four times a day (QID) | 2 refills | Status: DC

## 2016-09-19 MED ORDER — ACCU-CHEK SOFTCLIX LANCETS MISC: 12 refills | Status: DC

## 2016-09-19 MED ORDER — GLUCOSE BLOOD VI STRP: ORAL_STRIP | 12 refills | Status: DC

## 2016-09-19 NOTE — Telephone Encounter (Signed)
Patient called with lancet and test strips names. Armandina StammerJennifer Howard RNBSN

## 2016-10-04 ENCOUNTER — Other Ambulatory Visit: Payer: Self-pay

## 2016-10-04 MED ORDER — GLUCOSE BLOOD VI STRP: ORAL_STRIP | 12 refills | Status: DC

## 2016-10-04 NOTE — Progress Notes (Signed)
Patient called stating she is out of test strips. Patient states that [employer] covered the ones we sent in but for some reason it is not cover now.   Sent in new brand of test strips. Sabrina StammerJennifer Aniruddh Ciavarella RNBSN

## 2016-10-08 ENCOUNTER — Other Ambulatory Visit (HOSPITAL_COMMUNITY): Payer: Self-pay | Admitting: Maternal and Fetal Medicine

## 2016-10-08 ENCOUNTER — Ambulatory Visit (HOSPITAL_COMMUNITY)
Admission: RE | Admit: 2016-10-08 | Discharge: 2016-10-08 | Disposition: A | Discharge status: Home / Self Care | Payer: Medicaid Other | Source: Ambulatory Visit | Attending: Obstetrics and Gynecology | Admitting: Obstetrics and Gynecology

## 2016-10-08 ENCOUNTER — Encounter (HOSPITAL_COMMUNITY): Payer: Self-pay

## 2016-10-08 VITALS — BP 116/68 | HR 101 | Wt 173.6 lb

## 2016-10-08 DIAGNOSIS — O99332 Smoking (tobacco) complicating pregnancy, second trimester: Secondary | ICD-10-CM

## 2016-10-08 DIAGNOSIS — O26892 Other specified pregnancy related conditions, second trimester: Secondary | ICD-10-CM

## 2016-10-08 DIAGNOSIS — Z6791 Unspecified blood type, Rh negative: Secondary | ICD-10-CM | POA: Insufficient documentation

## 2016-10-08 DIAGNOSIS — O24919 Unspecified diabetes mellitus in pregnancy, unspecified trimester: Secondary | ICD-10-CM

## 2016-10-08 DIAGNOSIS — O26893 Other specified pregnancy related conditions, third trimester: Secondary | ICD-10-CM | POA: Diagnosis not present

## 2016-10-08 DIAGNOSIS — O24415 Gestational diabetes mellitus in pregnancy, controlled by oral hypoglycemic drugs: Secondary | ICD-10-CM

## 2016-10-08 DIAGNOSIS — O24112 Pre-existing diabetes mellitus, type 2, in pregnancy, second trimester: Secondary | ICD-10-CM | POA: Insufficient documentation

## 2016-10-08 DIAGNOSIS — Z3A26 26 weeks gestation of pregnancy: Secondary | ICD-10-CM | POA: Insufficient documentation

## 2016-10-08 DIAGNOSIS — O09212 Supervision of pregnancy with history of pre-term labor, second trimester: Secondary | ICD-10-CM | POA: Diagnosis not present

## 2016-10-08 DIAGNOSIS — Z7984 Long term (current) use of oral hypoglycemic drugs: Secondary | ICD-10-CM

## 2016-10-14 ENCOUNTER — Encounter: Payer: Medicaid Other | Admitting: Family Medicine

## 2016-10-30 ENCOUNTER — Ambulatory Visit (INDEPENDENT_AMBULATORY_CARE_PROVIDER_SITE_OTHER): Payer: Medicaid Other | Admitting: Obstetrics & Gynecology

## 2016-10-30 VITALS — BP 121/63 | HR 102 | Wt 174.0 lb

## 2016-10-30 DIAGNOSIS — O360910 Maternal care for other rhesus isoimmunization, first trimester, not applicable or unspecified: Secondary | ICD-10-CM | POA: Diagnosis not present

## 2016-10-30 DIAGNOSIS — E119 Type 2 diabetes mellitus without complications: Secondary | ICD-10-CM | POA: Diagnosis not present

## 2016-10-30 DIAGNOSIS — Z23 Encounter for immunization: Secondary | ICD-10-CM

## 2016-10-30 DIAGNOSIS — O24113 Pre-existing diabetes mellitus, type 2, in pregnancy, third trimester: Secondary | ICD-10-CM

## 2016-10-30 DIAGNOSIS — O0991 Supervision of high risk pregnancy, unspecified, first trimester: Secondary | ICD-10-CM

## 2016-10-30 DIAGNOSIS — O09213 Supervision of pregnancy with history of pre-term labor, third trimester: Secondary | ICD-10-CM

## 2016-10-30 DIAGNOSIS — Z8751 Personal history of pre-term labor: Secondary | ICD-10-CM

## 2016-10-30 DIAGNOSIS — Z720 Tobacco use: Secondary | ICD-10-CM

## 2016-10-30 DIAGNOSIS — Z6791 Unspecified blood type, Rh negative: Secondary | ICD-10-CM

## 2016-10-30 DIAGNOSIS — O26899 Other specified pregnancy related conditions, unspecified trimester: Secondary | ICD-10-CM

## 2016-10-30 MED ORDER — RHO D IMMUNE GLOBULIN 1500 UNIT/2ML IJ SOSY: 300.0000 ug | PREFILLED_SYRINGE | Freq: Once | INTRAMUSCULAR | Status: DC

## 2016-10-30 MED ORDER — RHO D IMMUNE GLOBULIN 1500 UNIT/2ML IJ SOSY
300.0000 ug | PREFILLED_SYRINGE | Freq: Once | INTRAMUSCULAR | Status: AC
  Administered 2016-10-30: 300 ug via INTRAMUSCULAR

## 2016-10-30 NOTE — Addendum Note (Signed)
Addended by: Granville LewisLARK, Aleina Burgio L on: 10/30/2016 05:07 PM   Modules accepted: Orders

## 2016-10-30 NOTE — Progress Notes (Signed)
   PRENATAL VISIT NOTE  Subjective:  Sabrina Griffin is a 30 y.o. [phone] at 6274w0d being seen today for ongoing prenatal care.  She is currently monitored for the following issues for this high-risk pregnancy and has Supervision of high risk pregnancy in first trimester; Pre-existing type 2 diabetes mellitus during pregnancy in first trimester; Chronic headache; History of preterm delivery; Tobacco abuse; and Rh negative state in antepartum period on her problem list. She reports normal sugars with 500 mg of metformin at night.  Patient reports no complaints.  Contractions: Not present. Vag. Bleeding: None.  Movement: Present. Denies leaking of fluid.   The following portions of the patient's history were reviewed and updated as appropriate: allergies, current medications, past family history, past medical history, past social history, past surgical history and problem list. Problem list updated.  Objective:   Vitals:   10/30/16 1614  BP: 121/63  Pulse: (!) 102  Weight: 174 lb (78.9 kg)    Fetal Status:     Movement: Present     General:  Alert, oriented and cooperative. Patient is in no acute distress.  Skin: Skin is warm and dry. No rash noted.   Cardiovascular: Normal heart rate noted  Respiratory: Normal respiratory effort, no problems with respiration noted  Abdomen: Soft, gravid, appropriate for gestational age. Pain/Pressure: Absent     Pelvic:  Cervical exam deferred        Extremities: Normal range of motion.  Edema: Trace  Mental Status: Normal mood and affect. Normal behavior. Normal judgment and thought content.   Assessment and Plan:  Pregnancy: [id number] at 7474w0d  1. History of preterm delivery  - CBC - Antibody screen; Future  2. Rh negative state in antepartum period  - CBC - Antibody screen; Future  3. Supervision of high risk pregnancy in first trimester  - HIV antibody (with reflex) - CBC - RPR - Hemoglobin A1c - Antibody screen; Future  4.  Tobacco abuse  - CBC - Antibody screen; Future  Preterm labor symptoms and general obstetric precautions including but not limited to vaginal bleeding, contractions, leaking of fluid and fetal movement were reviewed in detail with the patient. Please refer to After Visit Summary for other counseling recommendations.  No Follow-up on file.   Allie BossierMyra C Charma Mocarski, MD

## 2016-10-30 NOTE — Addendum Note (Signed)
Addended by: Granville LewisLARK, Bunnie Lederman L on: 10/30/2016 05:04 PM   Modules accepted: Orders

## 2016-10-31 LAB — CBC
HCT: 35 % (ref 35.0–45.0)
Hemoglobin: 11.4 g/dL — ABNORMAL LOW (ref 11.7–15.5)
MCH: 28.9 pg (ref 27.0–33.0)
MCHC: 32.6 g/dL (ref 32.0–36.0)
MCV: 88.8 fL (ref 80.0–100.0)
MPV: 9.4 fL (ref 7.5–12.5)
PLATELETS: 374 10*3/uL (ref 140–400)
RBC: 3.94 MIL/uL (ref 3.80–5.10)
RDW: 13 % (ref 11.0–15.0)
WBC: 11.5 10*3/uL — AB (ref 3.8–10.8)

## 2016-10-31 LAB — HEMOGLOBIN A1C
Hgb A1c MFr Bld: 5 % (ref <5.7)
MEAN PLASMA GLUCOSE: 97 mg/dL

## 2016-10-31 LAB — RPR

## 2016-10-31 LAB — HIV ANTIBODY (ROUTINE TESTING W REFLEX): HIV: NONREACTIVE

## 2016-10-31 LAB — ANTIBODY SCREEN: Antibody Screen: NEGATIVE

## 2016-11-04 ENCOUNTER — Other Ambulatory Visit (HOSPITAL_COMMUNITY): Payer: Self-pay | Admitting: *Deleted

## 2016-11-04 ENCOUNTER — Encounter (HOSPITAL_COMMUNITY): Payer: Self-pay

## 2016-11-04 ENCOUNTER — Ambulatory Visit (HOSPITAL_COMMUNITY)
Admission: RE | Admit: 2016-11-04 | Discharge: 2016-11-04 | Disposition: A | Discharge status: Home / Self Care | Payer: Medicaid Other | Source: Ambulatory Visit | Attending: Obstetrics and Gynecology | Admitting: Obstetrics and Gynecology

## 2016-11-04 DIAGNOSIS — O0991 Supervision of high risk pregnancy, unspecified, first trimester: Secondary | ICD-10-CM

## 2016-11-04 DIAGNOSIS — Z3A30 30 weeks gestation of pregnancy: Secondary | ICD-10-CM | POA: Diagnosis not present

## 2016-11-04 DIAGNOSIS — O09213 Supervision of pregnancy with history of pre-term labor, third trimester: Secondary | ICD-10-CM | POA: Diagnosis not present

## 2016-11-04 DIAGNOSIS — O99333 Smoking (tobacco) complicating pregnancy, third trimester: Secondary | ICD-10-CM | POA: Diagnosis not present

## 2016-11-04 DIAGNOSIS — Z7984 Long term (current) use of oral hypoglycemic drugs: Secondary | ICD-10-CM | POA: Insufficient documentation

## 2016-11-04 DIAGNOSIS — O24919 Unspecified diabetes mellitus in pregnancy, unspecified trimester: Secondary | ICD-10-CM

## 2016-11-04 DIAGNOSIS — O24113 Pre-existing diabetes mellitus, type 2, in pregnancy, third trimester: Secondary | ICD-10-CM | POA: Diagnosis not present

## 2016-11-04 DIAGNOSIS — O24319 Unspecified pre-existing diabetes mellitus in pregnancy, unspecified trimester: Secondary | ICD-10-CM

## 2016-11-13 ENCOUNTER — Encounter: Payer: Self-pay | Admitting: Obstetrics & Gynecology

## 2016-11-13 ENCOUNTER — Ambulatory Visit (INDEPENDENT_AMBULATORY_CARE_PROVIDER_SITE_OTHER): Payer: Medicaid Other | Admitting: Obstetrics & Gynecology

## 2016-11-13 DIAGNOSIS — O0990 Supervision of high risk pregnancy, unspecified, unspecified trimester: Secondary | ICD-10-CM

## 2016-11-13 DIAGNOSIS — O0993 Supervision of high risk pregnancy, unspecified, third trimester: Secondary | ICD-10-CM

## 2016-11-13 MED ORDER — CYCLOBENZAPRINE HCL 5 MG PO TABS: 5.0000 mg | ORAL_TABLET | Freq: Every day | ORAL | 2 refills | Status: DC

## 2016-11-13 NOTE — Patient Instructions (Signed)
Third Trimester of Pregnancy The third trimester is from week 29 through week 40 (months 7 through 9). The third trimester is a time when the unborn baby (fetus) is growing rapidly. At the end of the ninth month, the fetus is about 20 inches in length and weighs 6-10 pounds. Body changes during your third trimester Your body goes through many changes during pregnancy. The changes vary from woman to woman. During the third trimester:  Your weight will continue to increase. You can expect to gain 25-35 pounds (11-16 kg) by the end of the pregnancy.  You may begin to get stretch marks on your hips, abdomen, and breasts.  You may urinate more often because the fetus is moving lower into your pelvis and pressing on your bladder.  You may develop or continue to have heartburn. This is caused by increased hormones that slow down muscles in the digestive tract.  You may develop or continue to have constipation because increased hormones slow digestion and cause the muscles that push waste through your intestines to relax.  You may develop hemorrhoids. These are swollen veins (varicose veins) in the rectum that can itch or be painful.  You may develop swollen, bulging veins (varicose veins) in your legs.  You may have increased body aches in the pelvis, back, or thighs. This is due to weight gain and increased hormones that are relaxing your joints.  You may have changes in your hair. These can include thickening of your hair, rapid growth, and changes in texture. Some women also have hair loss during or after pregnancy, or hair that feels dry or thin. Your hair will most likely return to normal after your baby is born.  Your breasts will continue to grow and they will continue to become tender. A yellow fluid (colostrum) may leak from your breasts. This is the first milk you are producing for your baby.  Your belly button may stick out.  You may notice more swelling in your hands, face, or  ankles.  You may have increased tingling or numbness in your hands, arms, and legs. The skin on your belly may also feel numb.  You may feel short of breath because of your expanding uterus.  You may have more problems sleeping. This can be caused by the size of your belly, increased need to urinate, and an increase in your body's metabolism.  You may notice the fetus "dropping," or moving lower in your abdomen.  You may have increased vaginal discharge.  Your cervix becomes thin and soft (effaced) near your due date. What to expect at prenatal visits You will have prenatal exams every 2 weeks until week 36. Then you will have weekly prenatal exams. During a routine prenatal visit:  You will be weighed to make sure you and the fetus are growing normally.  Your blood pressure will be taken.  Your abdomen will be measured to track your baby's growth.  The fetal heartbeat will be listened to.  Any test results from the previous visit will be discussed.  You may have a cervical check near your due date to see if you have effaced. At around 36 weeks, your health care provider will check your cervix. At the same time, your health care provider will also perform a test on the secretions of the vaginal tissue. This test is to determine if a type of bacteria, Group B streptococcus, is present. Your health care provider will explain this further. Your health care provider may ask you:    What your birth plan is.  How you are feeling.  If you are feeling the baby move.  If you have had any abnormal symptoms, such as leaking fluid, bleeding, severe headaches, or abdominal cramping.  If you are using any tobacco products, including cigarettes, chewing tobacco, and electronic cigarettes.  If you have any questions. Other tests or screenings that may be performed during your third trimester include:  Blood tests that check for low iron levels (anemia).  Fetal testing to check the health,  activity level, and growth of the fetus. Testing is done if you have certain medical conditions or if there are problems during the pregnancy.  Nonstress test (NST). This test checks the health of your baby to make sure there are no signs of problems, such as the baby not getting enough oxygen. During this test, a belt is placed around your belly. The baby is made to move, and its heart rate is monitored during movement. What is false labor? False labor is a condition in which you feel small, irregular tightenings of the muscles in the womb (contractions) that eventually go away. These are called Braxton Hicks contractions. Contractions may last for hours, days, or even weeks before true labor sets in. If contractions come at regular intervals, become more frequent, increase in intensity, or become painful, you should see your health care provider. What are the signs of labor?  Abdominal cramps.  Regular contractions that start at 10 minutes apart and become stronger and more frequent with time.  Contractions that start on the top of the uterus and spread down to the lower abdomen and back.  Increased pelvic pressure and dull back pain.  A watery or bloody mucus discharge that comes from the vagina.  Leaking of amniotic fluid. This is also known as your "water breaking." It could be a slow trickle or a gush. Let your doctor know if it has a color or strange odor. If you have any of these signs, call your health care provider right away, even if it is before your due date. Follow these instructions at home: Eating and drinking  Continue to eat regular, healthy meals.  Do not eat:  Raw meat or meat spreads.  Unpasteurized milk or cheese.  Unpasteurized juice.  Store-made salad.  Refrigerated smoked seafood.  Hot dogs or deli meat, unless they are piping hot.  More than 6 ounces of albacore tuna a week.  Shark, swordfish, king mackerel, or tile fish.  Store-made salads.  Raw  sprouts, such as mung bean or alfalfa sprouts.  Take prenatal vitamins as told by your health care provider.  Take 1000 mg of calcium daily as told by your health care provider.  If you develop constipation:  Take over-the-counter or prescription medicines.  Drink enough fluid to keep your urine clear or pale yellow.  Eat foods that are high in fiber, such as fresh fruits and vegetables, whole grains, and beans.  Limit foods that are high in fat and processed sugars, such as fried and sweet foods. Activity  Exercise only as directed by your health care provider. Healthy pregnant women should aim for 2 hours and 30 minutes of moderate exercise per week. If you experience any pain or discomfort while exercising, stop.  Avoid heavy lifting.  Do not exercise in extreme heat or humidity, or at high altitudes.  Wear low-heel, comfortable shoes.  Practice good posture.  Do not travel far distances unless it is absolutely necessary and only with the approval   of your health care provider.  Wear your seat belt at all times while in a car, on a bus, or on a plane.  Take frequent breaks and rest with your legs elevated if you have leg cramps or low back pain.  Do not use hot tubs, steam rooms, or saunas.  You may continue to have sex unless your health care provider tells you otherwise. Lifestyle  Do not use any products that contain nicotine or tobacco, such as cigarettes and e-cigarettes. If you need help quitting, ask your health care provider.  Do not drink alcohol.  Do not use any medicinal herbs or unprescribed drugs. These chemicals affect the formation and growth of the baby.  If you develop varicose veins:  Wear support pantyhose or compression stockings as told by your healthcare provider.  Elevate your feet for 15 minutes, 3-4 times a day.  Wear a supportive maternity bra to help with breast tenderness. General instructions  Take over-the-counter and prescription  medicines only as told by your health care provider. There are medicines that are either safe or unsafe to take during pregnancy.  Take warm sitz baths to soothe any pain or discomfort caused by hemorrhoids. Use hemorrhoid cream or witch hazel if your health care provider approves.  Avoid cat litter boxes and soil used by cats. These carry germs that can cause birth defects in the baby. If you have a cat, ask someone to clean the litter box for you.  To prepare for the arrival of your baby:  Take prenatal classes to understand, practice, and ask questions about the labor and delivery.  Make a trial run to the hospital.  Visit the hospital and tour the maternity area.  Arrange for maternity or paternity leave through employers.  Arrange for family and friends to take care of pets while you are in the hospital.  Purchase a rear-facing car seat and make sure you know how to install it in your car.  Pack your hospital bag.  Prepare the baby's nursery. Make sure to remove all pillows and stuffed animals from the baby's crib to prevent suffocation.  Visit your dentist if you have not gone during your pregnancy. Use a soft toothbrush to brush your teeth and be gentle when you floss.  Keep all prenatal follow-up visits as told by your health care provider. This is important. Contact a health care provider if:  You are unsure if you are in labor or if your water has broken.  You become dizzy.  You have mild pelvic cramps, pelvic pressure, or nagging pain in your abdominal area.  You have lower back pain.  You have persistent nausea, vomiting, or diarrhea.  You have an unusual or bad smelling vaginal discharge.  You have pain when you urinate. Get help right away if:  You have a fever.  You are leaking fluid from your vagina.  You have spotting or bleeding from your vagina.  You have severe abdominal pain or cramping.  You have rapid weight loss or weight gain.  You have  shortness of breath with chest pain.  You notice sudden or extreme swelling of your face, hands, ankles, feet, or legs.  Your baby makes fewer than 10 movements in 2 hours.  You have severe headaches that do not go away with medicine.  You have vision changes. Summary  The third trimester is from week 29 through week 40, months 7 through 9. The third trimester is a time when the unborn baby (fetus)   is growing rapidly.  During the third trimester, your discomfort may increase as you and your baby continue to gain weight. You may have abdominal, leg, and back pain, sleeping problems, and an increased need to urinate.  During the third trimester your breasts will keep growing and they will continue to become tender. A yellow fluid (colostrum) may leak from your breasts. This is the first milk you are producing for your baby.  False labor is a condition in which you feel small, irregular tightenings of the muscles in the womb (contractions) that eventually go away. These are called Braxton Hicks contractions. Contractions may last for hours, days, or even weeks before true labor sets in.  Signs of labor can include: abdominal cramps; regular contractions that start at 10 minutes apart and become stronger and more frequent with time; watery or bloody mucus discharge that comes from the vagina; increased pelvic pressure and dull back pain; and leaking of amniotic fluid. This information is not intended to replace advice given to you by your health care provider. Make sure you discuss any questions you have with your health care provider. Document Released: 11/12/2001 Document Revised: 04/25/2016 Document Reviewed: 01/19/2013 Elsevier Interactive Patient Education  2017 [employer] Inc[employer]  

## 2016-11-13 NOTE — Progress Notes (Signed)
Baby very active at night   PRENATAL VISIT NOTE  Subjective:  Sabrina Griffin is a 30 y.o. [phone] at 7415w0d being seen today for ongoing prenatal care.  She is currently monitored for the following issues for this high-risk pregnancy and has Supervision of high risk pregnancy, antepartum; Pre-existing type 2 diabetes mellitus during pregnancy, antepartum; Chronic headache; History of preterm delivery; Tobacco abuse; and Rh negative state in antepartum period on her problem list.  Patient reports backache and trouble sleeping due to pain.  Contractions: Irregular. Vag. Bleeding: None.  Movement: (!) Decreased. Denies leaking of fluid.   The following portions of the patient's history were reviewed and updated as appropriate: allergies, current medications, past family history, past medical history, past social history, past surgical history and problem list. Problem list updated.  Objective:   Vitals:   11/13/16 0902  BP: 128/75  Pulse: 98  Weight: 178 lb 3.2 oz (80.8 kg)    Fetal Status:     Movement: (!) Decreased     General:  Alert, oriented and cooperative. Patient is in no acute distress.  Skin: Skin is warm and dry. No rash noted.   Cardiovascular: Normal heart rate noted  Respiratory: Normal respiratory effort, no problems with respiration noted  Abdomen: Soft, gravid, appropriate for gestational age. Pain/Pressure: Present     Pelvic:  Cervical exam deferred        Extremities: Normal range of motion.  Edema: None  Mental Status: Normal mood and affect. Normal behavior. Normal judgment and thought content.   Assessment and Plan:  Pregnancy: [id number] at 8915w0d  1. Supervision of high risk pregnancy, antepartum Back spasms - cyclobenzaprine (FLEXERIL) 5 MG tablet; Take 1 tablet (5 mg total) by mouth at bedtime. For back spasms  Dispense: 20 tablet; Refill: 2 States FBS and PP are nl, continue Preterm labor symptoms and general obstetric precautions including but not  limited to vaginal bleeding, contractions, leaking of fluid and fetal movement were reviewed in detail with the patient. Please refer to After Visit Summary for other counseling recommendations.  Return in about 1 week (around 11/20/2016) for needs NST 2/week.   Adam PhenixJames G Caydence Enck, MD

## 2016-11-13 NOTE — Progress Notes (Signed)
Patient is having some increased back pain- she also states she is having more Braxton Hicks pain and pressure. She has  Decreased movement with her baby- moving mostly at night.

## 2016-11-19 ENCOUNTER — Ambulatory Visit: Payer: Medicaid Other | Admitting: *Deleted

## 2016-11-19 DIAGNOSIS — Z3493 Encounter for supervision of normal pregnancy, unspecified, third trimester: Secondary | ICD-10-CM

## 2016-11-19 MED ORDER — CITRANATAL HARMONY 27-1-260 MG PO CAPS: 1.0000 | ORAL_CAPSULE | Freq: Every day | ORAL | 11 refills | Status: DC

## 2016-11-19 NOTE — Progress Notes (Signed)
Pt is in office for NST today. Pt states that she would like PNV sent to pharmacy. [facility]ran[facility] has been ordered per pt request. Pt states she is still having back pain, no relief with use of muscle relaxer as prescribed. Pt was advised heat pack, warm bath, comfort measures.  Pt NST was reviewed with R.Denney, CNM. NST reactive and was taken off monitor.

## 2016-11-21 ENCOUNTER — Ambulatory Visit: Payer: Medicaid Other

## 2016-11-21 DIAGNOSIS — O0990 Supervision of high risk pregnancy, unspecified, unspecified trimester: Secondary | ICD-10-CM

## 2016-11-21 MED ORDER — OMEPRAZOLE 20 MG PO CPDR: 20.0000 mg | DELAYED_RELEASE_CAPSULE | Freq: Two times a day (BID) | ORAL | 1 refills | Status: DC

## 2016-11-26 ENCOUNTER — Inpatient Hospital Stay (HOSPITAL_COMMUNITY)
Admission: AD | Admit: 2016-11-26 | Discharge: 2016-11-26 | Disposition: A | Discharge status: Home / Self Care | Payer: Medicaid Other | Source: Ambulatory Visit | Attending: Family Medicine | Admitting: Family Medicine

## 2016-11-26 ENCOUNTER — Ambulatory Visit (INDEPENDENT_AMBULATORY_CARE_PROVIDER_SITE_OTHER): Payer: Medicaid Other | Admitting: Obstetrics

## 2016-11-26 ENCOUNTER — Inpatient Hospital Stay: Payer: Medicaid Other

## 2016-11-26 ENCOUNTER — Inpatient Hospital Stay (HOSPITAL_COMMUNITY): Payer: Medicaid Other

## 2016-11-26 ENCOUNTER — Encounter (HOSPITAL_COMMUNITY): Payer: Self-pay

## 2016-11-26 ENCOUNTER — Encounter: Payer: Self-pay | Admitting: Obstetrics

## 2016-11-26 DIAGNOSIS — M545 Low back pain: Secondary | ICD-10-CM | POA: Insufficient documentation

## 2016-11-26 DIAGNOSIS — Z3A33 33 weeks gestation of pregnancy: Secondary | ICD-10-CM | POA: Diagnosis not present

## 2016-11-26 DIAGNOSIS — F17210 Nicotine dependence, cigarettes, uncomplicated: Secondary | ICD-10-CM | POA: Insufficient documentation

## 2016-11-26 DIAGNOSIS — O26893 Other specified pregnancy related conditions, third trimester: Secondary | ICD-10-CM | POA: Diagnosis not present

## 2016-11-26 DIAGNOSIS — O09213 Supervision of pregnancy with history of pre-term labor, third trimester: Secondary | ICD-10-CM

## 2016-11-26 DIAGNOSIS — O99333 Smoking (tobacco) complicating pregnancy, third trimester: Secondary | ICD-10-CM | POA: Insufficient documentation

## 2016-11-26 DIAGNOSIS — O0993 Supervision of high risk pregnancy, unspecified, third trimester: Secondary | ICD-10-CM

## 2016-11-26 DIAGNOSIS — Z888 Allergy status to other drugs, medicaments and biological substances status: Secondary | ICD-10-CM | POA: Diagnosis not present

## 2016-11-26 DIAGNOSIS — O368190 Decreased fetal movements, unspecified trimester, not applicable or unspecified: Secondary | ICD-10-CM

## 2016-11-26 DIAGNOSIS — O368130 Decreased fetal movements, third trimester, not applicable or unspecified: Secondary | ICD-10-CM

## 2016-11-26 DIAGNOSIS — O368131 Decreased fetal movements, third trimester, fetus 1: Secondary | ICD-10-CM | POA: Diagnosis not present

## 2016-11-26 DIAGNOSIS — Z91040 Latex allergy status: Secondary | ICD-10-CM | POA: Diagnosis not present

## 2016-11-26 DIAGNOSIS — R109 Unspecified abdominal pain: Secondary | ICD-10-CM | POA: Diagnosis present

## 2016-11-26 DIAGNOSIS — O0990 Supervision of high risk pregnancy, unspecified, unspecified trimester: Secondary | ICD-10-CM

## 2016-11-26 DIAGNOSIS — O24313 Unspecified pre-existing diabetes mellitus in pregnancy, third trimester: Secondary | ICD-10-CM

## 2016-11-26 DIAGNOSIS — W19XXXA Unspecified fall, initial encounter: Secondary | ICD-10-CM | POA: Insufficient documentation

## 2016-11-26 LAB — URINALYSIS, ROUTINE W REFLEX MICROSCOPIC
Bilirubin Urine: NEGATIVE
GLUCOSE, UA: NEGATIVE mg/dL
HGB URINE DIPSTICK: NEGATIVE
Ketones, ur: NEGATIVE mg/dL
NITRITE: NEGATIVE
PH: 6 (ref 5.0–8.0)
PROTEIN: NEGATIVE mg/dL
SPECIFIC GRAVITY, URINE: 1.015 (ref 1.005–1.030)

## 2016-11-26 NOTE — Discharge Instructions (Signed)
Fetal Monitoring, Biophysical Profile You may have special tests during pregnancy to make sure that your developing baby (fetus) is healthy. These tests are called fetal monitoring. A biophysical profile is one type of fetal monitoring. You may have a biophysical profile if other types of fetal monitoring show that your baby may be at above-normal risk for certain problems. A biophysical profile is usually done during the last 3 months of pregnancy (third trimester). A biophysical profile combines two tests to check the health of your baby. In one test, you will have a device strapped to your belly to measure your baby's heart rate. This is a nonstress test. The other test involves using sound waves and a computer (ultrasound) to create an image of your baby inside your womb (uterus). Together, these tests tell your health care provider about the overall health of your baby. LET [facility] LLC[facility]OUR HEALTH CARE PROVIDER KNOW ABOUT:   Any allergies you have.   All medicines you are taking, including vitamins, herbs, eye drops, creams, and over-the-counter medicines.   Previous problems you or members of your family have had with the use of anesthetics.   Any blood disorders you have.   Any surgeries you have had.   Medical conditions you have.  RISKS AND COMPLICATIONS  There are no risks to you or your baby from a biophysical profile.  BEFORE THE PROCEDURE  Ask your health care provider how to prepare.   You may need to drink fluids so that you have a full bladder for your ultrasound.  You may also need to eat before you arrive for the test. That makes your baby more active. PROCEDURE The nonstress test part of the procedure may take 20-30 minutes. The ultrasound part may take up to 1 hour. You will be awake during the procedure. You do not need medicine or anesthesia.  You will lie back.  A belt will be placed around your belly. The belt has a sensor to measure your baby's heart rate.  If your  baby is asleep and not moving around, a buzzer may be used to wake your baby.  You may have to wear another belt and sensor to measure any muscle movements (contractions) in your uterus.  During the ultrasound, a health care provider or technician will gently roll a handheld device (transducer) over your belly. This device sends signals to a computer that creates images of your baby.  Five areas of your baby's health and development are checked during the biophysical profile:  Heart rate.  Breathing.  Movement.  Active muscle movement (muscle tone).  The amount of fluid in your uterus (amniotic fluid). AFTER THE PROCEDURE   Your health care provider will discuss your results with you. The results of a biophysical profile are scored in a range from 0 to 10. If you get a score less than 4, you may need further testing, or your baby may need to be delivered early. A score of 8 to 10 is considered good.  Unless you need additional testing, you can go home right after the procedure and resume your normal activities. This information is not intended to replace advice given to you by your health care provider. Make sure you discuss any questions you have with your health care provider. Document Released: 11/08/2002 Document Revised: 11/23/2013 Document Reviewed: 09/10/2013 Elsevier Interactive Patient Education  2017 [employer] Inc[employer] What Do I Need to Know About Injuries During Pregnancy? Injuries can happen during pregnancy. Minor falls and accidents usually do not  harm you or your baby. However, any injury should be reported to your doctor. What can I do to protect myself from injuries?  Remove rugs and loose objects on the floor.  Wear comfortable shoes that have a good grip. Do not wear high-heeled shoes.  Always wear your seat belt. The lap belt should be below your belly. Always practice safe driving.  Do not ride on a motorcycle.  Do not participate in high-impact activities or  sports.  Avoid:  Walking on wet or slippery floors.  Fires.  Starting fires.  Lifting heavy pots of boiling or hot liquids.  Fixing electrical problems.  Only take medicine as told by your doctor.  Know your blood type and the blood type of the baby's father.  Call your local emergency services (911 in the [place] if you are a victim of domestic violence or assault. For help and support, contact the [employer] Domestic Violence Hotline. Get help right away if:  You fall on your belly or have any high-impact accident or injury.  You have been a victim of domestic violence or any kind of violence.  You have been in a car accident.  You have bleeding from your vagina.  Fluid is leaking from your vagina.  You start to have belly cramping (contractions) or pain.  You feel weak or pass out (faint).  You start to throw up (vomit) after an injury.  You have been burned.  You have a stiff neck or neck pain.  You get a headache or have vision problems after an injury.  You do not feel the baby move or the baby is not moving as much as normal. This information is not intended to replace advice given to you by your health care provider. Make sure you discuss any questions you have with your health care provider. Document Released: 12/21/2010 Document Revised: 04/25/2016 Document Reviewed: 08/25/2013 Elsevier Interactive Patient Education  2017 [employer]

## 2016-11-26 NOTE — MAU Note (Signed)
Patient presents from office. Patient fell on Saturday and does not know how she fell. Patient states that she had mud on her left side and back. Since then patient states that she has been having sharp mid abdominal pain that comes and goes. Patient also states that she has constant lower back pain.
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
## 2016-11-26 NOTE — Progress Notes (Signed)
Subjective:    Sabrina Griffin is a 30 y.o. female being seen today for her obstetrical visit. She is at 3363w6d gestation.  Had a fall over the weekend.  Patient reports backache, occasional contractions and abdominal pain. Fetal movement: decreased.  Problem List Items Addressed This Visit    Supervision of high risk pregnancy, antepartum     Patient Active Problem List   Diagnosis Date Noted  . Rh negative state in antepartum period 06/20/2016  . Pre-existing type 2 diabetes mellitus during pregnancy, antepartum 06/19/2016  . Chronic headache 06/19/2016  . History of preterm delivery 06/19/2016  . Tobacco abuse 06/19/2016  . Supervision of high risk pregnancy, antepartum 06/11/2016   Objective:    BP 122/76   Pulse (!) 113   Temp 98.4 F (36.9 C)   Wt 180 lb 11.2 oz (82 kg)   LMP 04/03/2016   BMI 27.48 kg/m  FHT:  150 BPM  Uterine Size: size equals dates  Presentation: unsure     Assessment:    Pregnancy @ 6163w6d weeks    S/P fall and subsequent back pain, abdominal pain and decreased intensity of fetal movements  Plan:     [facility]   labs reviewed, problem list updated Consent signed. GBS sent TDAP offered  Rhogam given for RH negative Pediatrician: discussed. Infant feeding: plans to breastfeed. Maternity leave: discussed. Cigarette smoking: smokes 0.5 PPD. No orders of the defined types were placed in this encounter.  No orders of the defined types were placed in this encounter.  Follow up in 2 Weeks.

## 2016-11-28 ENCOUNTER — Other Ambulatory Visit (HOSPITAL_COMMUNITY): Payer: Self-pay | Admitting: Advanced Practice Midwife

## 2016-11-28 ENCOUNTER — Other Ambulatory Visit: Payer: Medicaid Other

## 2016-11-28 DIAGNOSIS — O09213 Supervision of pregnancy with history of pre-term labor, third trimester: Secondary | ICD-10-CM

## 2016-11-28 DIAGNOSIS — O24313 Unspecified pre-existing diabetes mellitus in pregnancy, third trimester: Secondary | ICD-10-CM

## 2016-11-28 DIAGNOSIS — O368130 Decreased fetal movements, third trimester, not applicable or unspecified: Secondary | ICD-10-CM

## 2016-11-28 DIAGNOSIS — Z3A33 33 weeks gestation of pregnancy: Secondary | ICD-10-CM

## 2016-12-02 NOTE — L&D Delivery Note (Signed)
30 y.o. [id number] at 6393w1d delivered a viable female infant in cephalic, LOA position. x1 nuchal cord, easily reduced. Anterior shoulder delivered with ease. 60 sec delayed cord clamping. Cord clamped x2 and cut. Placenta delivered spontaneously intact, with 3VC. Fundus firm on exam with massage and pitocin. Good hemostasis noted.  Laceration: none Suture: none Good hemostasis noted. EBL 100cc  Mom and baby recovering in LDR.    Apgars: 9/9 Weight:    Renne Muscaaniel L Warden, MD PGY-1 12/26/2016, 4:00 AM  Patient is a [id number] at 6193w1d who was admitted via EMS in active labor, significant hx of DM-2.  She progressed without augmentation to SVD.  I was gloved and present for delivery in its entirety.  Second stage of labor progressed, baby delivered after 2-3 contractions.  Mild decels during second stage noted.  Complications: none  Lacerations: none  EBL: 100cc  Cam HaiSHAW, Rossy Virag, CNM 7:20 AM  12/26/2016

## 2016-12-03 ENCOUNTER — Encounter (HOSPITAL_COMMUNITY): Payer: Self-pay

## 2016-12-03 ENCOUNTER — Other Ambulatory Visit (HOSPITAL_COMMUNITY): Payer: Self-pay | Admitting: *Deleted

## 2016-12-03 ENCOUNTER — Encounter (HOSPITAL_COMMUNITY): Payer: Self-pay | Admitting: Advanced Practice Midwife

## 2016-12-03 ENCOUNTER — Ambulatory Visit (INDEPENDENT_AMBULATORY_CARE_PROVIDER_SITE_OTHER): Payer: Medicaid Other | Admitting: Obstetrics and Gynecology

## 2016-12-03 ENCOUNTER — Ambulatory Visit (HOSPITAL_COMMUNITY)
Admission: RE | Admit: 2016-12-03 | Discharge: 2016-12-03 | Disposition: A | Discharge status: Home / Self Care | Payer: Medicaid Other | Source: Ambulatory Visit | Attending: Obstetrics and Gynecology | Admitting: Obstetrics and Gynecology

## 2016-12-03 ENCOUNTER — Other Ambulatory Visit (HOSPITAL_COMMUNITY): Payer: Self-pay | Admitting: Maternal and Fetal Medicine

## 2016-12-03 VITALS — BP 135/74 | HR 107 | Wt 183.0 lb

## 2016-12-03 DIAGNOSIS — O24313 Unspecified pre-existing diabetes mellitus in pregnancy, third trimester: Secondary | ICD-10-CM | POA: Diagnosis present

## 2016-12-03 DIAGNOSIS — O09213 Supervision of pregnancy with history of pre-term labor, third trimester: Secondary | ICD-10-CM | POA: Diagnosis not present

## 2016-12-03 DIAGNOSIS — Z8279 Family history of other congenital malformations, deformations and chromosomal abnormalities: Secondary | ICD-10-CM

## 2016-12-03 DIAGNOSIS — O09299 Supervision of pregnancy with other poor reproductive or obstetric history, unspecified trimester: Secondary | ICD-10-CM

## 2016-12-03 DIAGNOSIS — O24319 Unspecified pre-existing diabetes mellitus in pregnancy, unspecified trimester: Secondary | ICD-10-CM

## 2016-12-03 DIAGNOSIS — O24113 Pre-existing diabetes mellitus, type 2, in pregnancy, third trimester: Secondary | ICD-10-CM

## 2016-12-03 DIAGNOSIS — Z3A34 34 weeks gestation of pregnancy: Secondary | ICD-10-CM

## 2016-12-03 DIAGNOSIS — Z7984 Long term (current) use of oral hypoglycemic drugs: Principal | ICD-10-CM

## 2016-12-03 DIAGNOSIS — O4103X0 Oligohydramnios, third trimester, not applicable or unspecified: Secondary | ICD-10-CM

## 2016-12-03 DIAGNOSIS — O24119 Pre-existing diabetes mellitus, type 2, in pregnancy, unspecified trimester: Secondary | ICD-10-CM

## 2016-12-03 DIAGNOSIS — O0990 Supervision of high risk pregnancy, unspecified, unspecified trimester: Secondary | ICD-10-CM

## 2016-12-03 DIAGNOSIS — Z8751 Personal history of pre-term labor: Secondary | ICD-10-CM

## 2016-12-03 DIAGNOSIS — O24415 Gestational diabetes mellitus in pregnancy, controlled by oral hypoglycemic drugs: Secondary | ICD-10-CM

## 2016-12-03 DIAGNOSIS — O26899 Other specified pregnancy related conditions, unspecified trimester: Secondary | ICD-10-CM

## 2016-12-03 DIAGNOSIS — Z6791 Unspecified blood type, Rh negative: Secondary | ICD-10-CM

## 2016-12-03 DIAGNOSIS — O09899 Supervision of other high risk pregnancies, unspecified trimester: Secondary | ICD-10-CM

## 2016-12-03 DIAGNOSIS — J069 Acute upper respiratory infection, unspecified: Secondary | ICD-10-CM

## 2016-12-03 DIAGNOSIS — B9789 Other viral agents as the cause of diseases classified elsewhere: Secondary | ICD-10-CM

## 2016-12-03 DIAGNOSIS — O4100X0 Oligohydramnios, unspecified trimester, not applicable or unspecified: Secondary | ICD-10-CM | POA: Insufficient documentation

## 2016-12-03 MED ORDER — AZITHROMYCIN 250 MG PO TABS: ORAL_TABLET | ORAL | 1 refills | Status: DC

## 2016-12-03 NOTE — Progress Notes (Signed)
Subjective:  Sabrina Griffin is a 31 y.o. [id number] at 5758w6d being seen today for ongoing prenatal care.  She is currently monitored for the following issues for this high-risk pregnancy and has Supervision of high risk pregnancy, antepartum; Pre-existing type 2 diabetes mellitus during pregnancy, antepartum; Chronic headache; History of preterm delivery; Tobacco abuse; Rh negative state in antepartum period; and Low amniotic fluid on her problem list.  Patient reports backache.  Contractions: Irregular. Vag. Bleeding: None.  Movement: Present. Denies leaking of fluid.   The following portions of the patient's history were reviewed and updated as appropriate: allergies, current medications, past family history, past medical history, past social history, past surgical history and problem list. Problem list updated.  Objective:   Vitals:   12/03/16 1449  BP: 135/74  Pulse: (!) 107  Weight: 183 lb (83 kg)    Fetal Status: Fetal Heart Rate (bpm): NST   Movement: Present     General:  Alert, oriented and cooperative. Patient is in no acute distress.  Skin: Skin is warm and dry. No rash noted.   Cardiovascular: Normal heart rate noted  Respiratory: Normal respiratory effort, no problems with respiration noted  Abdomen: Soft, gravid, appropriate for gestational age. Pain/Pressure: Present     Pelvic:  Cervical exam deferred        Extremities: Normal range of motion.  Edema: Trace  Mental Status: Normal mood and affect. Normal behavior. Normal judgment and thought content.   Urinalysis:      Assessment and Plan:  Pregnancy: [id number] at 7358w6d  1. Supervision of high risk pregnancy, antepartum GBS next visit  2. Pre-existing type 2 diabetes mellitus during pregnancy, antepartum Did not bring log in but reports blood sugars in range Continue with Metformin and antenatal testing  3. History of preterm delivery Declined 17 OHP  4. Rh negative state in antepartum period Rhogam PP  5.  Low AFI Followed by MFM U/S today, report not available yet  6. URI  Z pac Preterm labor symptoms and general obstetric precautions including but not limited to vaginal bleeding, contractions, leaking of fluid and fetal movement were reviewed in detail with the patient. Please refer to After Visit Summary for other counseling recommendations.  Return in about 1 week (around 12/10/2016) for OB visit.   Hermina StaggersMichael L Gissell Barra, MD

## 2016-12-03 NOTE — Progress Notes (Signed)
Patient states that she thinks she started losing mucous plug, reports contractions that come and go, good fetal movement.

## 2016-12-05 ENCOUNTER — Other Ambulatory Visit: Payer: Medicaid Other

## 2016-12-06 ENCOUNTER — Ambulatory Visit (HOSPITAL_COMMUNITY)
Admission: RE | Admit: 2016-12-06 | Discharge: 2016-12-06 | Disposition: A | Discharge status: Home / Self Care | Payer: Medicaid Other | Source: Ambulatory Visit | Attending: Obstetrics and Gynecology | Admitting: Obstetrics and Gynecology

## 2016-12-06 ENCOUNTER — Encounter (HOSPITAL_COMMUNITY): Payer: Self-pay

## 2016-12-06 DIAGNOSIS — O09219 Supervision of pregnancy with history of pre-term labor, unspecified trimester: Secondary | ICD-10-CM | POA: Insufficient documentation

## 2016-12-06 DIAGNOSIS — Z3A35 35 weeks gestation of pregnancy: Secondary | ICD-10-CM | POA: Insufficient documentation

## 2016-12-06 DIAGNOSIS — O24415 Gestational diabetes mellitus in pregnancy, controlled by oral hypoglycemic drugs: Secondary | ICD-10-CM | POA: Diagnosis present

## 2016-12-06 DIAGNOSIS — Z7984 Long term (current) use of oral hypoglycemic drugs: Secondary | ICD-10-CM | POA: Insufficient documentation

## 2016-12-06 DIAGNOSIS — Z8279 Family history of other congenital malformations, deformations and chromosomal abnormalities: Secondary | ICD-10-CM | POA: Insufficient documentation

## 2016-12-10 ENCOUNTER — Other Ambulatory Visit (HOSPITAL_COMMUNITY)
Admission: RE | Admit: 2016-12-10 | Discharge: 2016-12-10 | Disposition: A | Discharge status: Home / Self Care | Payer: Medicaid Other | Source: Ambulatory Visit | Attending: Obstetrics and Gynecology | Admitting: Obstetrics and Gynecology

## 2016-12-10 ENCOUNTER — Ambulatory Visit (INDEPENDENT_AMBULATORY_CARE_PROVIDER_SITE_OTHER): Payer: Medicaid Other | Admitting: Obstetrics and Gynecology

## 2016-12-10 VITALS — BP 125/76 | HR 101 | Temp 98.4°F | Wt 180.0 lb

## 2016-12-10 DIAGNOSIS — O26899 Other specified pregnancy related conditions, unspecified trimester: Secondary | ICD-10-CM

## 2016-12-10 DIAGNOSIS — O09213 Supervision of pregnancy with history of pre-term labor, third trimester: Secondary | ICD-10-CM

## 2016-12-10 DIAGNOSIS — Z113 Encounter for screening for infections with a predominantly sexual mode of transmission: Secondary | ICD-10-CM | POA: Diagnosis not present

## 2016-12-10 DIAGNOSIS — O0990 Supervision of high risk pregnancy, unspecified, unspecified trimester: Secondary | ICD-10-CM

## 2016-12-10 DIAGNOSIS — O24119 Pre-existing diabetes mellitus, type 2, in pregnancy, unspecified trimester: Secondary | ICD-10-CM

## 2016-12-10 DIAGNOSIS — O24113 Pre-existing diabetes mellitus, type 2, in pregnancy, third trimester: Secondary | ICD-10-CM

## 2016-12-10 DIAGNOSIS — O4103X0 Oligohydramnios, third trimester, not applicable or unspecified: Secondary | ICD-10-CM

## 2016-12-10 DIAGNOSIS — Z6791 Unspecified blood type, Rh negative: Secondary | ICD-10-CM

## 2016-12-10 DIAGNOSIS — Z8751 Personal history of pre-term labor: Secondary | ICD-10-CM

## 2016-12-10 DIAGNOSIS — Z3A35 35 weeks gestation of pregnancy: Secondary | ICD-10-CM

## 2016-12-10 NOTE — Progress Notes (Signed)
Subjective:  Sabrina Griffin is a 31 y.o. [id number] at 6268w6d being seen today for ongoing prenatal care.  She is currently monitored for the following issues for this high-risk pregnancy and has Supervision of high risk pregnancy, antepartum; Pre-existing type 2 diabetes mellitus during pregnancy, antepartum; Chronic headache; History of preterm delivery; Tobacco abuse; Rh negative state in antepartum period; Low amniotic fluid; and URI (upper respiratory infection) on her problem list.  Patient reports URI SX.  Contractions: Irregular. Vag. Bleeding: None.  Movement: Present. Denies leaking of fluid.   The following portions of the patient's history were reviewed and updated as appropriate: allergies, current medications, past family history, past medical history, past social history, past surgical history and problem list. Problem list updated.  Objective:   Vitals:   12/10/16 1121  BP: 125/76  Pulse: (!) 101  Temp: 98.4 F (36.9 C)  Weight: 180 lb (81.6 kg)    Fetal Status: Fetal Heart Rate (bpm): NST   Movement: Present     General:  Alert, oriented and cooperative. Patient is in no acute distress.  Skin: Skin is warm and dry. No rash noted.   Cardiovascular: Normal heart rate noted  Respiratory: Normal respiratory effort, no problems with respiration noted  Abdomen: Soft, gravid, appropriate for gestational age. Pain/Pressure: Present     Pelvic:  Cervical exam performed        Extremities: Normal range of motion.  Edema: Trace  Mental Status: Normal mood and affect. Normal behavior. Normal judgment and thought content.   Urinalysis:      Assessment and Plan:  Pregnancy: [id number] at 3068w6d  1. [redacted] weeks gestation of pregnancy  - Strep Gp B NAA - GC/Chlamydia probe amp ([place])[facility] at [facility]  2. Oligohydramnios in third trimester, single or unspecified fetus Last AFI normal Continue with weekly AFI checks   3. Rh negative state in antepartum period Rhogam PP  4.  History of preterm delivery Declined 17 OHP  5. Supervision of high risk pregnancy, antepartum   6. Pre-existing type 2 diabetes mellitus during pregnancy, antepartum BS stable  Continue with Metformin and antenatal testing  Preterm labor symptoms and general obstetric precautions including but not limited to vaginal bleeding, contractions, leaking of fluid and fetal movement were reviewed in detail with the patient. Please refer to After Visit Summary for other counseling recommendations.  Return in about 1 week (around 12/17/2016) for OB visit.   Hermina StaggersMichael L Coreen Shippee, MD

## 2016-12-10 NOTE — Progress Notes (Signed)
Patient is in the office, reports good fetal movement. 

## 2016-12-11 LAB — GC/CHLAMYDIA PROBE AMP (~~LOC~~) NOT AT ARMC
Chlamydia: NEGATIVE
Neisseria Gonorrhea: NEGATIVE

## 2016-12-12 LAB — STREP GP B NAA: STREP GROUP B AG: NEGATIVE

## 2016-12-13 ENCOUNTER — Ambulatory Visit (HOSPITAL_COMMUNITY)
Admission: RE | Admit: 2016-12-13 | Discharge: 2016-12-13 | Disposition: A | Discharge status: Home / Self Care | Payer: Medicaid Other | Source: Ambulatory Visit | Attending: Obstetrics and Gynecology | Admitting: Obstetrics and Gynecology

## 2016-12-13 ENCOUNTER — Encounter (HOSPITAL_COMMUNITY): Payer: Self-pay

## 2016-12-13 ENCOUNTER — Other Ambulatory Visit (HOSPITAL_COMMUNITY): Payer: Self-pay | Admitting: Maternal and Fetal Medicine

## 2016-12-13 DIAGNOSIS — O09899 Supervision of other high risk pregnancies, unspecified trimester: Secondary | ICD-10-CM

## 2016-12-13 DIAGNOSIS — O09219 Supervision of pregnancy with history of pre-term labor, unspecified trimester: Secondary | ICD-10-CM

## 2016-12-13 DIAGNOSIS — Z8279 Family history of other congenital malformations, deformations and chromosomal abnormalities: Secondary | ICD-10-CM

## 2016-12-13 DIAGNOSIS — O09213 Supervision of pregnancy with history of pre-term labor, third trimester: Secondary | ICD-10-CM | POA: Diagnosis not present

## 2016-12-13 DIAGNOSIS — O24415 Gestational diabetes mellitus in pregnancy, controlled by oral hypoglycemic drugs: Secondary | ICD-10-CM

## 2016-12-13 DIAGNOSIS — Z3A36 36 weeks gestation of pregnancy: Secondary | ICD-10-CM | POA: Diagnosis not present

## 2016-12-13 DIAGNOSIS — O24313 Unspecified pre-existing diabetes mellitus in pregnancy, third trimester: Secondary | ICD-10-CM

## 2016-12-13 DIAGNOSIS — Z79899 Other long term (current) drug therapy: Secondary | ICD-10-CM | POA: Diagnosis not present

## 2016-12-13 DIAGNOSIS — O0990 Supervision of high risk pregnancy, unspecified, unspecified trimester: Secondary | ICD-10-CM

## 2016-12-17 ENCOUNTER — Ambulatory Visit (INDEPENDENT_AMBULATORY_CARE_PROVIDER_SITE_OTHER): Payer: Medicaid Other | Admitting: Obstetrics & Gynecology

## 2016-12-17 VITALS — BP 121/77 | HR 101 | Wt 182.0 lb

## 2016-12-17 DIAGNOSIS — K219 Gastro-esophageal reflux disease without esophagitis: Secondary | ICD-10-CM

## 2016-12-17 DIAGNOSIS — O0990 Supervision of high risk pregnancy, unspecified, unspecified trimester: Secondary | ICD-10-CM

## 2016-12-17 DIAGNOSIS — O24113 Pre-existing diabetes mellitus, type 2, in pregnancy, third trimester: Secondary | ICD-10-CM

## 2016-12-17 DIAGNOSIS — O24119 Pre-existing diabetes mellitus, type 2, in pregnancy, unspecified trimester: Secondary | ICD-10-CM

## 2016-12-17 MED ORDER — PANTOPRAZOLE SODIUM 40 MG PO TBEC: 40.0000 mg | DELAYED_RELEASE_TABLET | Freq: Every day | ORAL | 1 refills | Status: DC

## 2016-12-17 NOTE — Progress Notes (Signed)
   PRENATAL VISIT NOTE  Subjective:  Sabrina Griffin is a 31 y.o. [phone] at 1818w6d being seen today for ongoing prenatal care.  She is currently monitored for the following issues for this high-risk pregnancy and has Supervision of high risk pregnancy, antepartum; Pre-existing type 2 diabetes mellitus during pregnancy, antepartum; Chronic headache; History of preterm delivery; Tobacco abuse; Rh negative state in antepartum period; URI (upper respiratory infection); Type 2 diabetes mellitus without complication (HCC); and GERD (gastroesophageal reflux disease) on her problem list.  Patient reports heartburn and right hand numbness.  Contractions: Irregular. Vag. Bleeding: None.  Movement: Present. Denies leaking of fluid.   The following portions of the patient's history were reviewed and updated as appropriate: allergies, current medications, past family history, past medical history, past social history, past surgical history and problem list. Problem list updated.  Objective:   Vitals:   12/17/16 1007  BP: 121/77  Pulse: (!) 101  Weight: 182 lb (82.6 kg)    Fetal Status: Fetal Heart Rate (bpm): NST   Movement: Present     General:  Alert, oriented and cooperative. Patient is in no acute distress.  Skin: Skin is warm and dry. No rash noted.   Cardiovascular: Normal heart rate noted  Respiratory: Normal respiratory effort, no problems with respiration noted  Abdomen: Soft, gravid, appropriate for gestational age. Pain/Pressure: Present     Pelvic:  Cervical exam deferred        Extremities: Normal range of motion.  Edema: Trace  Mental Status: Normal mood and affect. Normal behavior. Normal judgment and thought content.   Assessment and Plan:  Pregnancy: [id number] at 2618w6d  1. Gastroesophageal reflux disease, esophagitis presence not specified  - pantoprazole (PROTONIX) 40 MG tablet; Take 1 tablet (40 mg total) by mouth daily.  Dispense: 30 tablet; Refill: 1  2. Supervision of  high risk pregnancy, antepartum C/w right CTS - Wrist brace cock up volar  3. Pre-existing type 2 diabetes mellitus during pregnancy, antepartum FBS <90 and PP <125, continue Metformin. NST today reactive, nl AFI and growth on US  Preterm labor symptoms and general obstetric precautions including but not limited to vaginal bleeding, contractions, leaking of fluid and fetal movement were reviewed in detail with the patient. Please refer to After Visit Summary for other counseling recommendations.  Return in about 1 week (around 12/24/2016) for NST 2/week, . Schedule IOL 39 weeks 1/31  Adam PhenixJames G Arnold, MD

## 2016-12-20 ENCOUNTER — Ambulatory Visit (HOSPITAL_COMMUNITY)
Admission: RE | Admit: 2016-12-20 | Discharge: 2016-12-20 | Disposition: A | Discharge status: Home / Self Care | Payer: Medicaid Other | Source: Ambulatory Visit | Attending: Obstetrics and Gynecology | Admitting: Obstetrics and Gynecology

## 2016-12-20 ENCOUNTER — Ambulatory Visit (HOSPITAL_COMMUNITY): Payer: Medicaid Other

## 2016-12-20 ENCOUNTER — Encounter (HOSPITAL_COMMUNITY): Payer: Self-pay

## 2016-12-20 ENCOUNTER — Ambulatory Visit (HOSPITAL_COMMUNITY)
Admission: RE | Admit: 2016-12-20 | Discharge: 2016-12-20 | Disposition: A | Discharge status: Home / Self Care | Payer: Medicaid Other | Source: Ambulatory Visit | Attending: Maternal and Fetal Medicine | Admitting: Maternal and Fetal Medicine

## 2016-12-20 ENCOUNTER — Other Ambulatory Visit (HOSPITAL_COMMUNITY): Payer: Self-pay | Admitting: *Deleted

## 2016-12-20 DIAGNOSIS — Z7984 Long term (current) use of oral hypoglycemic drugs: Secondary | ICD-10-CM | POA: Diagnosis not present

## 2016-12-20 DIAGNOSIS — Z8279 Family history of other congenital malformations, deformations and chromosomal abnormalities: Secondary | ICD-10-CM | POA: Diagnosis not present

## 2016-12-20 DIAGNOSIS — O24919 Unspecified diabetes mellitus in pregnancy, unspecified trimester: Secondary | ICD-10-CM

## 2016-12-20 DIAGNOSIS — O24313 Unspecified pre-existing diabetes mellitus in pregnancy, third trimester: Secondary | ICD-10-CM | POA: Insufficient documentation

## 2016-12-20 DIAGNOSIS — O24415 Gestational diabetes mellitus in pregnancy, controlled by oral hypoglycemic drugs: Secondary | ICD-10-CM

## 2016-12-20 DIAGNOSIS — O0990 Supervision of high risk pregnancy, unspecified, unspecified trimester: Secondary | ICD-10-CM

## 2016-12-20 DIAGNOSIS — Z3A37 37 weeks gestation of pregnancy: Secondary | ICD-10-CM | POA: Diagnosis not present

## 2016-12-24 ENCOUNTER — Ambulatory Visit (INDEPENDENT_AMBULATORY_CARE_PROVIDER_SITE_OTHER): Payer: Medicaid Other | Admitting: Obstetrics and Gynecology

## 2016-12-24 VITALS — BP 121/89 | HR 106 | Wt 183.0 lb

## 2016-12-24 DIAGNOSIS — O26899 Other specified pregnancy related conditions, unspecified trimester: Principal | ICD-10-CM

## 2016-12-24 DIAGNOSIS — O24119 Pre-existing diabetes mellitus, type 2, in pregnancy, unspecified trimester: Secondary | ICD-10-CM

## 2016-12-24 DIAGNOSIS — Z6791 Unspecified blood type, Rh negative: Secondary | ICD-10-CM

## 2016-12-24 DIAGNOSIS — O0990 Supervision of high risk pregnancy, unspecified, unspecified trimester: Secondary | ICD-10-CM

## 2016-12-24 DIAGNOSIS — O24113 Pre-existing diabetes mellitus, type 2, in pregnancy, third trimester: Secondary | ICD-10-CM

## 2016-12-24 NOTE — Progress Notes (Signed)
Subjective:  Sabrina Griffin is a 31 y.o. [id number] at 5342w6d being seen today for ongoing prenatal care.  She is currently monitored for the following issues for this high-risk pregnancy and has Supervision of high risk pregnancy, antepartum; Pre-existing type 2 diabetes mellitus during pregnancy, antepartum; Chronic headache; History of preterm delivery; Tobacco abuse; Rh negative state in antepartum period; Type 2 diabetes mellitus without complication (HCC); and GERD (gastroesophageal reflux disease) on her problem list.  Patient reports carpal tunnel symptoms.  Contractions: Irregular. Vag. Bleeding: None.  Movement: Present. Denies leaking of fluid.   The following portions of the patient's history were reviewed and updated as appropriate: allergies, current medications, past family history, past medical history, past social history, past surgical history and problem list. Problem list updated.  Objective:   Vitals:   12/24/16 1058  BP: 121/89  Pulse: (!) 106  Weight: 183 lb (83 kg)    Fetal Status: Fetal Heart Rate (bpm): NST   Movement: Present     General:  Alert, oriented and cooperative. Patient is in no acute distress.  Skin: Skin is warm and dry. No rash noted.   Cardiovascular: Normal heart rate noted  Respiratory: Normal respiratory effort, no problems with respiration noted  Abdomen: Soft, gravid, appropriate for gestational age. Pain/Pressure: Present     Pelvic:  Cervical exam deferred        Extremities: Normal range of motion.  Edema: Trace  Mental Status: Normal mood and affect. Normal behavior. Normal judgment and thought content.   Urinalysis:      Assessment and Plan:  Pregnancy: [id number] at 7242w6d  1. Rh negative state in antepartum period Treat PP as indicated  2. Supervision of high risk pregnancy, antepartum    3. Pre-existing type 2 diabetes mellitus during pregnancy, antepartum BS goal range Continue with Metformin Twice weekly testing IOL  scheduled at 39 wks on 01/01/17  Term labor symptoms and general obstetric precautions including but not limited to vaginal bleeding, contractions, leaking of fluid and fetal movement were reviewed in detail with the patient. Please refer to After Visit Summary for other counseling recommendations.  Return in about 1 week (around 12/31/2016) for OB visit.   Hermina StaggersMichael L Deette Revak, MD

## 2016-12-24 NOTE — Patient Instructions (Signed)

## 2016-12-25 ENCOUNTER — Telehealth (HOSPITAL_COMMUNITY): Payer: Self-pay | Admitting: *Deleted

## 2016-12-25 ENCOUNTER — Encounter (HOSPITAL_COMMUNITY): Payer: Self-pay

## 2016-12-25 ENCOUNTER — Inpatient Hospital Stay (HOSPITAL_COMMUNITY)
Admission: AD | Admit: 2016-12-25 | Discharge: 2016-12-26 | Disposition: A | Discharge status: Home / Self Care | Payer: Medicaid Other | Source: Ambulatory Visit | Attending: Obstetrics & Gynecology | Admitting: Obstetrics & Gynecology

## 2016-12-25 DIAGNOSIS — O0990 Supervision of high risk pregnancy, unspecified, unspecified trimester: Secondary | ICD-10-CM

## 2016-12-25 NOTE — Telephone Encounter (Signed)
Preadmission screen  

## 2016-12-25 NOTE — Progress Notes (Addendum)
G6P3 @ [redacted] wksga. Presents to triage for labor pain. Ctx started since 1800. Denies bleeding. questionalbe leaking. +FM. EFM applies. VSS. See flow sheet for details.   2007: provider in unit. Made awere of SVE. Ordered to recheck cervix in another hr. Informed provider of pt questionable LOF and slide done for ferning. Ferning negative. Provider aware.

## 2016-12-25 NOTE — MAU Note (Signed)
Pt reports leaking fluid and contractions.

## 2016-12-26 ENCOUNTER — Encounter (HOSPITAL_COMMUNITY): Payer: Self-pay

## 2016-12-26 ENCOUNTER — Inpatient Hospital Stay (HOSPITAL_COMMUNITY)
Admission: AD | Admit: 2016-12-26 | Discharge: 2016-12-28 | DRG: 774 | Disposition: A | Discharge status: Home / Self Care | Payer: Medicaid Other | Source: Ambulatory Visit | Attending: Obstetrics & Gynecology | Admitting: Obstetrics & Gynecology

## 2016-12-26 DIAGNOSIS — E119 Type 2 diabetes mellitus without complications: Secondary | ICD-10-CM | POA: Diagnosis present

## 2016-12-26 DIAGNOSIS — Z6791 Unspecified blood type, Rh negative: Secondary | ICD-10-CM | POA: Diagnosis not present

## 2016-12-26 DIAGNOSIS — O2412 Pre-existing diabetes mellitus, type 2, in childbirth: Secondary | ICD-10-CM | POA: Diagnosis present

## 2016-12-26 DIAGNOSIS — F17210 Nicotine dependence, cigarettes, uncomplicated: Secondary | ICD-10-CM | POA: Diagnosis present

## 2016-12-26 DIAGNOSIS — O99334 Smoking (tobacco) complicating childbirth: Secondary | ICD-10-CM | POA: Diagnosis present

## 2016-12-26 DIAGNOSIS — O9962 Diseases of the digestive system complicating childbirth: Secondary | ICD-10-CM | POA: Diagnosis present

## 2016-12-26 DIAGNOSIS — O4292 Full-term premature rupture of membranes, unspecified as to length of time between rupture and onset of labor: Secondary | ICD-10-CM | POA: Diagnosis present

## 2016-12-26 DIAGNOSIS — Z7984 Long term (current) use of oral hypoglycemic drugs: Secondary | ICD-10-CM | POA: Diagnosis not present

## 2016-12-26 DIAGNOSIS — K219 Gastro-esophageal reflux disease without esophagitis: Secondary | ICD-10-CM | POA: Diagnosis present

## 2016-12-26 DIAGNOSIS — Z3A38 38 weeks gestation of pregnancy: Secondary | ICD-10-CM | POA: Diagnosis not present

## 2016-12-26 DIAGNOSIS — O26893 Other specified pregnancy related conditions, third trimester: Secondary | ICD-10-CM | POA: Diagnosis present

## 2016-12-26 DIAGNOSIS — Z3493 Encounter for supervision of normal pregnancy, unspecified, third trimester: Secondary | ICD-10-CM | POA: Diagnosis present

## 2016-12-26 DIAGNOSIS — Z3490 Encounter for supervision of normal pregnancy, unspecified, unspecified trimester: Secondary | ICD-10-CM

## 2016-12-26 LAB — CBC
HCT: 35.5 % — ABNORMAL LOW (ref 36.0–46.0)
Hemoglobin: 12.2 g/dL (ref 12.0–15.0)
MCH: 30.1 pg (ref 26.0–34.0)
MCHC: 34.4 g/dL (ref 30.0–36.0)
MCV: 87.7 fL (ref 78.0–100.0)
PLATELETS: 315 10*3/uL (ref 150–400)
RBC: 4.05 MIL/uL (ref 3.87–5.11)
RDW: 14.6 % (ref 11.5–15.5)
WBC: 28 10*3/uL — AB (ref 4.0–10.5)

## 2016-12-26 MED ORDER — DIPHENHYDRAMINE HCL 25 MG PO CAPS: 25.0000 mg | ORAL_CAPSULE | Freq: Four times a day (QID) | ORAL | Status: DC | PRN

## 2016-12-26 MED ORDER — OXYTOCIN 40 UNITS IN LACTATED RINGERS INFUSION - SIMPLE MED
2.5000 [IU]/h | INTRAVENOUS | Status: DC
  Administered 2016-12-26: 2.5 [IU]/h via INTRAVENOUS

## 2016-12-26 MED ORDER — ACETAMINOPHEN 325 MG PO TABS: 650.0000 mg | ORAL_TABLET | ORAL | Status: DC | PRN

## 2016-12-26 MED ORDER — SENNOSIDES-DOCUSATE SODIUM 8.6-50 MG PO TABS
2.0000 | ORAL_TABLET | ORAL | Status: DC
  Administered 2016-12-27: 2 via ORAL
  Filled 2016-12-26: qty 2

## 2016-12-26 MED ORDER — WITCH HAZEL-GLYCERIN EX PADS: 1.0000 "application " | MEDICATED_PAD | CUTANEOUS | Status: DC | PRN

## 2016-12-26 MED ORDER — LACTATED RINGERS IV SOLN: 500.0000 mL | INTRAVENOUS | Status: DC | PRN

## 2016-12-26 MED ORDER — SODIUM CHLORIDE 0.9% FLUSH
3.0000 mL | Freq: Three times a day (TID) | INTRAVENOUS | Status: DC
  Administered 2016-12-26: 3 mL via INTRAVENOUS

## 2016-12-26 MED ORDER — COCONUT OIL OIL
1.0000 "application " | TOPICAL_OIL | Status: DC | PRN
  Administered 2016-12-27: 1 via TOPICAL
  Filled 2016-12-26: qty 120

## 2016-12-26 MED ORDER — ACETAMINOPHEN 325 MG PO TABS
650.0000 mg | ORAL_TABLET | ORAL | Status: DC | PRN
  Administered 2016-12-26 (×2): 650 mg via ORAL
  Filled 2016-12-26 (×2): qty 2

## 2016-12-26 MED ORDER — SOD CITRATE-CITRIC ACID 500-334 MG/5ML PO SOLN: 30.0000 mL | ORAL | Status: DC | PRN

## 2016-12-26 MED ORDER — TETANUS-DIPHTH-ACELL PERTUSSIS 5-2.5-18.5 LF-MCG/0.5 IM SUSP: 0.5000 mL | Freq: Once | INTRAMUSCULAR | Status: DC

## 2016-12-26 MED ORDER — FLEET ENEMA 7-19 GM/118ML RE ENEM: 1.0000 | ENEMA | RECTAL | Status: DC | PRN

## 2016-12-26 MED ORDER — ONDANSETRON HCL 4 MG/2ML IJ SOLN: 4.0000 mg | Freq: Four times a day (QID) | INTRAMUSCULAR | Status: DC | PRN

## 2016-12-26 MED ORDER — IBUPROFEN 600 MG PO TABS
600.0000 mg | ORAL_TABLET | Freq: Four times a day (QID) | ORAL | Status: DC
  Administered 2016-12-26: 600 mg via ORAL
  Filled 2016-12-26: qty 1

## 2016-12-26 MED ORDER — ZOLPIDEM TARTRATE 5 MG PO TABS: 5.0000 mg | ORAL_TABLET | Freq: Every evening | ORAL | Status: DC | PRN

## 2016-12-26 MED ORDER — ONDANSETRON HCL 4 MG PO TABS: 4.0000 mg | ORAL_TABLET | ORAL | Status: DC | PRN

## 2016-12-26 MED ORDER — IBUPROFEN 600 MG PO TABS
600.0000 mg | ORAL_TABLET | Freq: Four times a day (QID) | ORAL | Status: DC
  Administered 2016-12-26 – 2016-12-28 (×10): 600 mg via ORAL
  Filled 2016-12-26 (×10): qty 1

## 2016-12-26 MED ORDER — BENZOCAINE-MENTHOL 20-0.5 % EX AERO
1.0000 "application " | INHALATION_SPRAY | CUTANEOUS | Status: DC | PRN
  Administered 2016-12-27: 1 via TOPICAL
  Filled 2016-12-26: qty 56

## 2016-12-26 MED ORDER — SENNOSIDES-DOCUSATE SODIUM 8.6-50 MG PO TABS: 2.0000 | ORAL_TABLET | ORAL | Status: DC

## 2016-12-26 MED ORDER — METFORMIN HCL 500 MG PO TABS
500.0000 mg | ORAL_TABLET | Freq: Every day | ORAL | Status: DC
  Filled 2016-12-26: qty 1

## 2016-12-26 MED ORDER — LORATADINE 10 MG PO TABS
10.0000 mg | ORAL_TABLET | Freq: Every day | ORAL | Status: AC
  Filled 2016-12-26: qty 1

## 2016-12-26 MED ORDER — LACTATED RINGERS IV SOLN: INTRAVENOUS | Status: DC

## 2016-12-26 MED ORDER — OXYCODONE-ACETAMINOPHEN 5-325 MG PO TABS: 1.0000 | ORAL_TABLET | ORAL | Status: DC | PRN

## 2016-12-26 MED ORDER — ONDANSETRON HCL 4 MG/2ML IJ SOLN: 4.0000 mg | INTRAMUSCULAR | Status: DC | PRN

## 2016-12-26 MED ORDER — PRENATAL MULTIVITAMIN CH
1.0000 | ORAL_TABLET | Freq: Every day | ORAL | Status: DC
  Administered 2016-12-26 – 2016-12-27 (×2): 1 via ORAL
  Filled 2016-12-26 (×2): qty 1

## 2016-12-26 MED ORDER — OXYTOCIN 10 UNIT/ML IJ SOLN
INTRAMUSCULAR | Status: AC
  Administered 2016-12-26: 10 [IU]
  Filled 2016-12-26: qty 1

## 2016-12-26 MED ORDER — SIMETHICONE 80 MG PO CHEW: 80.0000 mg | CHEWABLE_TABLET | ORAL | Status: DC | PRN

## 2016-12-26 MED ORDER — CETIRIZINE HCL 10 MG PO TABS
5.0000 mg | ORAL_TABLET | Freq: Every day | ORAL | Status: DC
  Filled 2016-12-26: qty 1

## 2016-12-26 MED ORDER — METFORMIN HCL 500 MG PO TABS: 500.0000 mg | ORAL_TABLET | Freq: Every day | ORAL | Status: DC

## 2016-12-26 MED ORDER — LIDOCAINE HCL (PF) 1 % IJ SOLN
INTRAMUSCULAR | Status: DC
Start: 2016-12-26 — End: 2016-12-26
  Filled 2016-12-26: qty 30

## 2016-12-26 MED ORDER — OXYCODONE-ACETAMINOPHEN 5-325 MG PO TABS: 2.0000 | ORAL_TABLET | ORAL | Status: DC | PRN

## 2016-12-26 MED ORDER — LIDOCAINE HCL (PF) 1 % IJ SOLN
30.0000 mL | INTRAMUSCULAR | Status: DC | PRN
  Filled 2016-12-26: qty 30

## 2016-12-26 MED ORDER — COCONUT OIL OIL: 1.0000 "application " | TOPICAL_OIL | Status: DC | PRN

## 2016-12-26 MED ORDER — DIBUCAINE 1 % RE OINT: 1.0000 "application " | TOPICAL_OINTMENT | RECTAL | Status: DC | PRN

## 2016-12-26 MED ORDER — OXYTOCIN BOLUS FROM INFUSION
500.0000 mL | Freq: Once | INTRAVENOUS | Status: AC
  Administered 2016-12-26: 500 mL via INTRAVENOUS

## 2016-12-26 MED ORDER — BENZOCAINE-MENTHOL 20-0.5 % EX AERO: 1.0000 "application " | INHALATION_SPRAY | CUTANEOUS | Status: DC | PRN

## 2016-12-26 NOTE — Lactation Note (Addendum)
This note was copied from a baby's chart. Lactation Consultation Note: Mother just finishing a feeding. I didn't see the infant latched on the breast. She was still cueing. Mother attempt to get infant latched on again but he was now asleep. Mother is experienced with breastfeeding 3 other children for 6 months. She states she bottle and breast all children. Mother states she is seeing colostrum when hand expressing. Mother denies having any concerns about breastfeeding. Suggested that mother feed infant 8-12 times in 24 hours and cue base feed. Discussed cluster feeding. Mother was given lactation brochure with information on BFSG'S, outpatient dept and phone line for any questions or concerns. Mother receptive to all teaching.   Patient Name: Sabrina Steva ReadyChristina Griffin [id number]'[id number]'s Date: 12/26/2016 Reason for consult: Initial assessment   Maternal Data    Feeding Length of feed: 10 min  LATCH Score/Interventions                      Lactation Tools Discussed/Used     Consult Status Consult Status: Follow-up Date: 12/27/16 Follow-up type: In-patient    Stevan BornKendrick, Merritt Kibby [facility] 12/26/2016, 1:34 PM
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
## 2016-12-26 NOTE — MAU Note (Signed)
PT  ARRIVED  VIA  EMS-   TO RM 6-  FHR  139- VE - COMPLETE, SROM  AND +1.  TO RM 166   VIA  STRETCHER.

## 2016-12-27 ENCOUNTER — Encounter (HOSPITAL_COMMUNITY): Payer: Self-pay

## 2016-12-27 ENCOUNTER — Ambulatory Visit (HOSPITAL_COMMUNITY)
Admission: RE | Admit: 2016-12-27 | Discharge: 2016-12-27 | Disposition: A | Discharge status: Home / Self Care | Payer: Medicaid Other | Source: Ambulatory Visit | Attending: Obstetrics and Gynecology | Admitting: Obstetrics and Gynecology

## 2016-12-27 ENCOUNTER — Ambulatory Visit (HOSPITAL_COMMUNITY): Payer: Medicaid Other

## 2016-12-27 LAB — CBC
HEMATOCRIT: 34.7 % — AB (ref 36.0–46.0)
Hemoglobin: 11.5 g/dL — ABNORMAL LOW (ref 12.0–15.0)
MCH: 29.6 pg (ref 26.0–34.0)
MCHC: 33.1 g/dL (ref 30.0–36.0)
MCV: 89.4 fL (ref 78.0–100.0)
Platelets: 270 10*3/uL (ref 150–400)
RBC: 3.88 MIL/uL (ref 3.87–5.11)
RDW: 15.1 % (ref 11.5–15.5)
WBC: 11.9 10*3/uL — ABNORMAL HIGH (ref 4.0–10.5)

## 2016-12-27 LAB — GLUCOSE, CAPILLARY: Glucose-Capillary: 76 mg/dL (ref 65–99)

## 2016-12-27 MED ORDER — OXYCODONE-ACETAMINOPHEN 5-325 MG PO TABS
1.0000 | ORAL_TABLET | ORAL | Status: DC | PRN
Start: 2016-12-27 — End: 2016-12-28

## 2016-12-27 MED ORDER — POLYETHYLENE GLYCOL 3350 17 G PO PACK
17.0000 g | PACK | Freq: Every day | ORAL | Status: DC
  Administered 2016-12-27 – 2016-12-28 (×2): 17 g via ORAL
  Filled 2016-12-27 (×2): qty 1

## 2016-12-27 MED ORDER — METFORMIN HCL 500 MG PO TABS
500.0000 mg | ORAL_TABLET | Freq: Every day | ORAL | Status: DC
  Filled 2016-12-27: qty 1

## 2016-12-27 MED ORDER — RHO D IMMUNE GLOBULIN 1500 UNIT/2ML IJ SOSY
300.0000 ug | PREFILLED_SYRINGE | Freq: Once | INTRAMUSCULAR | Status: AC
  Administered 2016-12-27: 300 ug via INTRAVENOUS
  Filled 2016-12-27: qty 2

## 2016-12-27 MED ORDER — SENNOSIDES-DOCUSATE SODIUM 8.6-50 MG PO TABS
2.0000 | ORAL_TABLET | Freq: Two times a day (BID) | ORAL | Status: DC
  Administered 2016-12-27 – 2016-12-28 (×3): 2 via ORAL
  Filled 2016-12-27 (×3): qty 2

## 2016-12-27 NOTE — Progress Notes (Signed)
Post Partum Day #1 Subjective: up ad lib, voiding and has not colostrum production, is concerned about episiotomy scar tearing, reports constipation: Miralax ordered.  Has scar tissue in breasts, currently supplementing with formula.    Objective: Blood pressure 114/68, pulse 66, temperature 97.6 F (36.4 C), temperature source Oral, resp. rate 18, height 5\' 8"  (1.727 m), weight 184 lb (83.5 kg), last menstrual period 04/03/2016, unknown if currently breastfeeding.  Physical Exam:  General: alert, cooperative and no distress Lochia: appropriate Uterine Fundus: firm Incision: none DVT Evaluation: No evidence of DVT seen on physical exam. No cords or calf tenderness. No significant calf/ankle edema.   Recent Labs  12/26/16 0646 12/27/16 0541  HGB 12.2 11.5*  HCT 35.5* 34.7*    Assessment/Plan: Plan for discharge tomorrow, Breastfeeding and Lactation consult.  Miralax ordered for constipation.    LOS: 1 day   Roe CoombsRachelle A Perian Tedder, CNM 12/27/2016, 8:08 AM

## 2016-12-27 NOTE — Plan of Care (Signed)
Problem: Nutritional: Goal: Mothers verbalization of comfort with breastfeeding process will improve Outcome: Not Progressing Last night the patient decided to supplement infant with formula as her nipples are sore and she states that the infant has not received enough colostrum to satisfy his nutritional needs. Patient states that it is her understanding that her breasts have been shown to have considerable scar tissue related to her first breastfeeding experience involving prolonged breast engorgement. At this time, the patient chooses to not attempt to breastfeed, to rest her nipples, and to solely bottle feed the infant formula. I reminded the patient that her breast milk supply will be reduced should she not breast feed on demand and/or stimulate her breasts by pumping. Patient verbalized understanding of this information and demonstrated knowledge of how much formula is recommended each feeding.Patient states that she has not yet used the DEBP. She states that she has not been able to hand-express colostrum upon numerous attempts to do so. We attempted to hand-express colostrum and were not successful. I encouraged the patient to call me for assistance should she decide to use the DEBP.

## 2016-12-27 NOTE — Lactation Note (Addendum)
This note was copied from a baby's chart. Lactation Consultation Note LC discussed with mom at length her history and experience with breastfeeding. Moms oldest child now 31 years old breastfed combined feedings with engorgement.  Baby born at 7729 weeks mom never pumped or latched baby.  At 2 1/2 months mom was treated for severe engorgement with report of scaring. Moms 2nd child combined breastfed for about 2 months with engorgement problems and low supply. Moms 3rd child combined breastfed for about 2 months and then milk dried up for unknown reason.  LC unsure if mom was ever able to establish a milk supply due to formula supplementation with all children and reports of engorgement for months.  Mom pumped once today and is unsure if she even wants to work on making milk.  Mom denies breast changes this pregnancy different from leaking with older children prior to delivery.  MOm is taking metformin for Type 2 diabetes.   LC did not assess breasts at this time.  LC ask how LC can offer support at this time and mom declines any needs at this time. Mom reports recent bottle feeding and plans to bottle feed during the night.    LC discussed with mom at length how to hand engorgement both is she wants to make milk and if she does not.  LC advised mom to use ice to breast as needed for comfort.     Patient Name: Boy Steva ReadyChristina Perrot ZOXWR'UToday's Date: 12/27/2016     Maternal Data    Feeding Feeding Type: Bottle Fed - Formula  LATCH Score/Interventions                      Lactation Tools Discussed/Used Pump Review: Setup, frequency, and cleaning;Milk Storage Initiated by:: Dolly RiasKim Isley, RN  Date initiated:: 12/27/16   Consult Status      Mikala Podoll, Arvella MerlesJana Lynn 12/27/2016, 9:12 PM

## 2016-12-28 LAB — RH IG WORKUP (INCLUDES ABO/RH)
ABO/RH(D): O NEG
FETAL SCREEN: NEGATIVE
Gestational Age(Wks): 38.1
UNIT DIVISION: 0

## 2016-12-28 LAB — GLUCOSE, CAPILLARY
GLUCOSE-CAPILLARY: 69 mg/dL (ref 65–99)
GLUCOSE-CAPILLARY: 85 mg/dL (ref 65–99)

## 2016-12-28 MED ORDER — POLYETHYLENE GLYCOL 3350 17 G PO PACK: 17.0000 g | PACK | Freq: Every day | ORAL | 0 refills | Status: DC

## 2016-12-28 MED ORDER — IBUPROFEN 600 MG PO TABS: 600.0000 mg | ORAL_TABLET | Freq: Four times a day (QID) | ORAL | 0 refills | Status: DC | PRN

## 2016-12-28 NOTE — Discharge Summary (Signed)
OB Discharge Summary     Patient Name: Sabrina Griffin DOB: 1986-06-13 MRN: [id number]  Date of admission: 12/26/2016 Delivering MD: Serita Grammes D   Date of discharge: 12/28/2016  Admitting diagnosis: 38w, Water Broke; Labor Check  Intrauterine pregnancy: [redacted]w[redacted]d    Secondary diagnosis:  Active Problems:   Pregnancy   Delivery normal  Additional problems: Rh neg; DM-2; tobacco abuse     Discharge diagnosis: Term Pregnancy Delivered and Type 2 DM                                                                                                Post partum procedures:rhogam  Augmentation: none  Complications: None  Hospital course:  Onset of Labor With Vaginal Delivery     31y.o. yo [id number] 361w1das admitted in Active Labor on 12/26/2016. Patient had an uncomplicated labor course as follows:  Membrane Rupture Time/Date:   ,    Intrapartum Procedures: Episiotomy: None [1]                                         Lacerations:  None [1]  Patient had a delivery of a Viable infant. 12/26/2016  Information for the patient's newborn:  RaMillisa, Giarrusso0[[id number]]Delivery Method: Vaginal, Spontaneous Delivery (Filed from Delivery Summary)    Pateint had an uncomplicated postpartum course.  Pt declines Metformin as she reports being hypoglycemic when taking it nonpregnant. She is ambulating, tolerating a regular diet, passing flatus, and urinating well. Patient is discharged home in stable condition on 12/28/16.    Physical exam  Vitals:   12/26/16 1735 12/27/16 0548 12/27/16 1713 12/28/16 0528  BP: 117/75 114/68 119/62 (!) 104/58  Pulse: 80 66 76 (!) 59  Resp: 20 18 (!) 21 17  Temp: 98.1 F (36.7 C) 97.6 F (36.4 C) 98 F (36.7 C) 97.5 F (36.4 C)  TempSrc: Oral Oral Oral Oral  SpO2:   99%   Weight:      Height:       General: alert and cooperative Lochia: appropriate Uterine Fundus: firm Incision: N/A DVT Evaluation: No evidence of DVT seen on physical  exam. Labs: Lab Results  Component Value Date   WBC 11.9 (H) 12/27/2016   HGB 11.5 (L) 12/27/2016   HCT 34.7 (L) 12/27/2016   MCV 89.4 12/27/2016   PLT 270 12/27/2016   CMP Latest Ref Rng & Units 02/28/2013  Glucose 65 - 99 mg/dL 101(H)  BUN 7 - 18 mg/dL 10  Creatinine 0.60 - 1.30 mg/dL 0.64  Sodium 136 - 145 mmol/L 137  Potassium 3.5 - 5.1 mmol/L 4.0  Chloride 98 - 107 mmol/L 105  CO2 21 - 32 mmol/L 30  Calcium 8.5 - 10.1 mg/dL 9.0  Total Protein 6.4 - 8.2 g/dL 7.6  Total Bilirubin 0.2 - 1.0 mg/dL 0.2  Alkaline Phos 50 - 136 Unit/L 68  AST 15 - 37 Unit/L 16  ALT 12 - 78 U/L 21  Discharge instruction: per After Visit Summary and "Baby and Me Booklet".  After visit meds:  Allergies as of 12/28/2016      Reactions   Onion Anaphylaxis   Other Anaphylaxis   Ketchup reaction   Almond Oil Hives   Prednisone    "makes me feel like I'm burning from the inside out."   Soy Allergy Hives   Latex Rash      Medication List    STOP taking these medications   metFORMIN 500 MG tablet Commonly known as:  GLUCOPHAGE     TAKE these medications   ACCU-CHEK SOFTCLIX LANCETS lancets Use as instructed   blood glucose meter kit and supplies Kit Dispense based on patient and insurance preference. Use up to four times daily as directed. (FOR ICD-9 250.00, 250.01).   CITRANATAL HARMONY 27-1-260 MG Caps Take 1 capsule by mouth daily.   glucose blood test strip Commonly known as:  ACCU-CHEK AVIVA Use as instructed   glucose blood test strip Use as instructed   ibuprofen 600 MG tablet Commonly known as:  ADVIL,MOTRIN Take 1 tablet (600 mg total) by mouth every 6 (six) hours as needed.   polyethylene glycol packet Commonly known as:  MIRALAX / GLYCOLAX Take 17 g by mouth daily.   STOOL SOFTENER 100 MG capsule Generic drug:  docusate sodium Take 100 mg by mouth 2 (two) times daily.   ZYRTEC ALLERGY PO Take 1 tablet by mouth daily.       Diet: carb modified  diet  Activity: Advance as tolerated. Pelvic rest for 6 weeks.   Outpatient follow up:6 weeks Follow up Appt:No future appointments. Follow up Visit:No Follow-up on file.  Postpartum contraception: Undecided  Newborn Data: Live born female  Birth Weight: 6 lb 14.2 oz (3125 g) APGAR: 9, 9  Baby Feeding: Bottle Disposition:home with mother   12/28/2016 Serita Grammes, CNM  10:00 AM

## 2016-12-28 NOTE — Progress Notes (Signed)
Hypoglycemic Event  CBG: 69  Treatment: 15 GM carbohydrate snack  Symptoms: None  Follow-up CBG: FIEP:[id number]:0628 CBG Result:85  Possible Reasons for Event: Unknown  Comments/MD notified:Kim Clelia CroftShaw, CNM on call. No new orders. Royston CowperIsley, Ceylon Arenson E, RN     Royston CowperIsley, Haruna Rohlfs E

## 2016-12-28 NOTE — Discharge Instructions (Signed)

## 2016-12-31 ENCOUNTER — Encounter: Payer: Medicaid Other | Admitting: Obstetrics & Gynecology

## 2017-01-01 ENCOUNTER — Inpatient Hospital Stay (HOSPITAL_COMMUNITY): Admission: RE | Admit: 2017-01-01 | Payer: Medicaid Other | Source: Ambulatory Visit

## 2017-01-22 ENCOUNTER — Other Ambulatory Visit: Payer: Self-pay | Admitting: Orthopedic Surgery

## 2017-01-22 DIAGNOSIS — M25512 Pain in left shoulder: Secondary | ICD-10-CM

## 2017-02-01 ENCOUNTER — Ambulatory Visit
Admission: RE | Admit: 2017-02-01 | Discharge: 2017-02-01 | Disposition: A | Discharge status: Home / Self Care | Payer: Medicaid Other | Source: Ambulatory Visit | Attending: Orthopedic Surgery | Admitting: Orthopedic Surgery

## 2017-02-01 DIAGNOSIS — M25512 Pain in left shoulder: Secondary | ICD-10-CM

## 2017-07-15 ENCOUNTER — Ambulatory Visit: Payer: Medicaid Other | Admitting: Obstetrics & Gynecology

## 2017-11-27 IMAGING — MR MR CERVICAL SPINE W/O CM
4 of 5 series · 28 of 48 positions shown · non-contrast
Comparison: None.

ADDENDUM:
Comparison was made with prior exam of 05/11/2015. No significant
interval change compared with 05/11/2015.
CLINICAL DATA: Posterior neck and bilateral shoulder pain.

EXAM:
MRI CERVICAL SPINE WITHOUT CONTRAST
TECHNIQUE: Multiplanar, multisequence MR imaging of the cervical spine was
performed. No intravenous contrast was administered.

[Series 3: T2 · sagittal · 3.0mm · 0.66mm/px · 6 of 13 slices shown (1 of 2)]
[im 1/13]
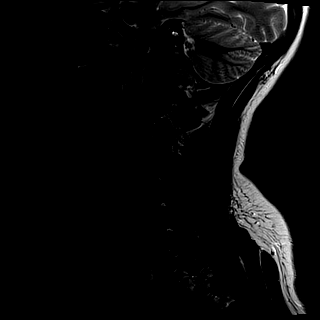
[im 3/13]
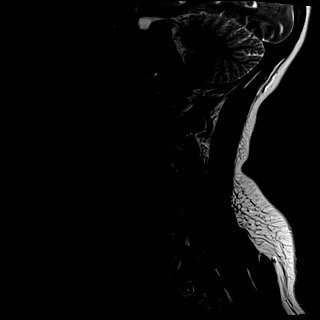
[im 5/13]
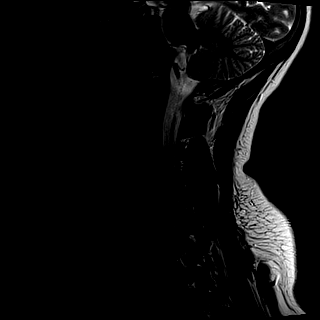
[im 8/13]
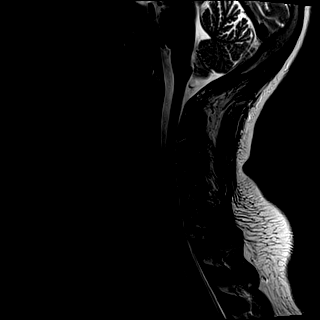
[im 10/13]
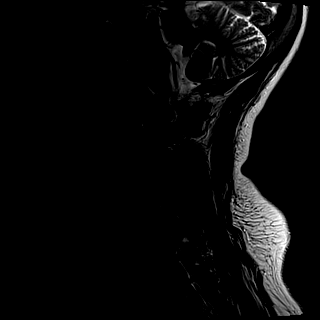
[im 13/13]
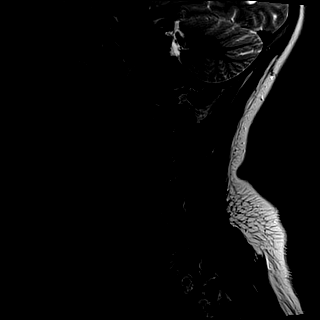

[Series 4: T1 · sagittal · 3.0mm · 0.41mm/px · 7 of 13 slices shown]
[im 1/13]
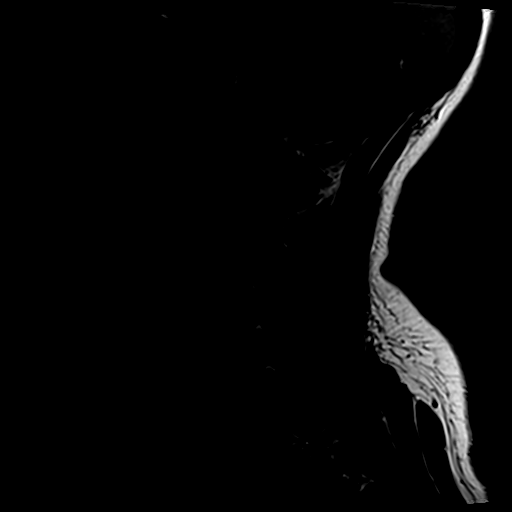
[im 3/13]
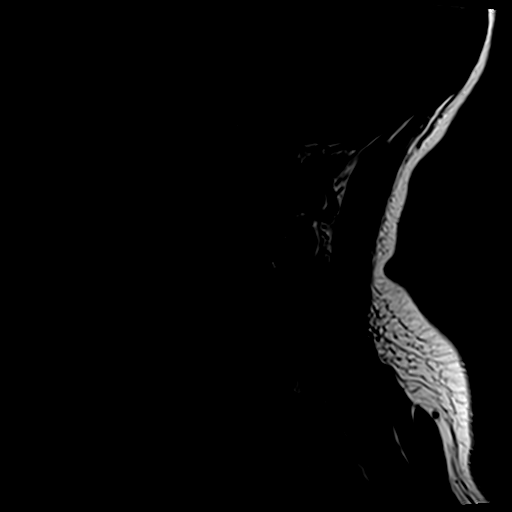
[im 5/13]
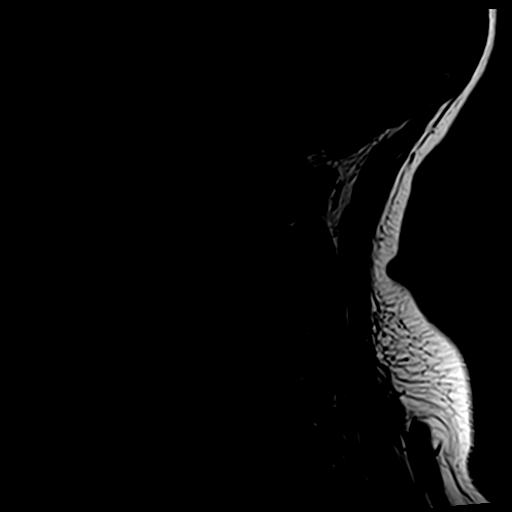
[im 7/13]
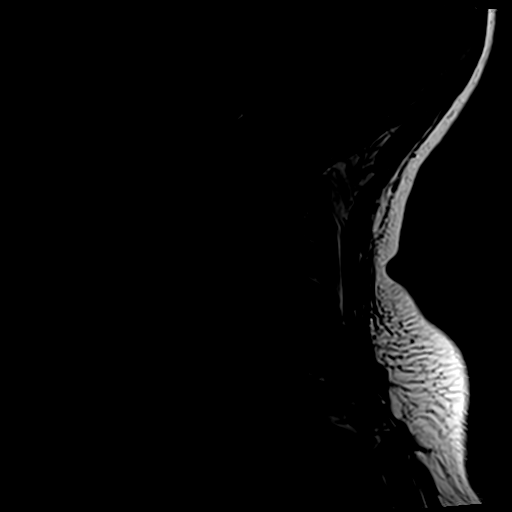
[im 9/13]
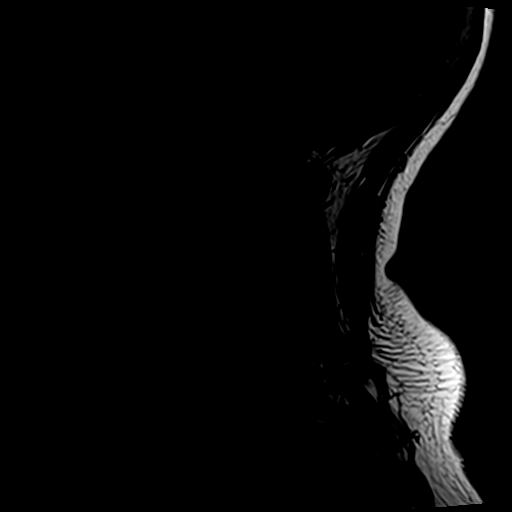
[im 11/13]
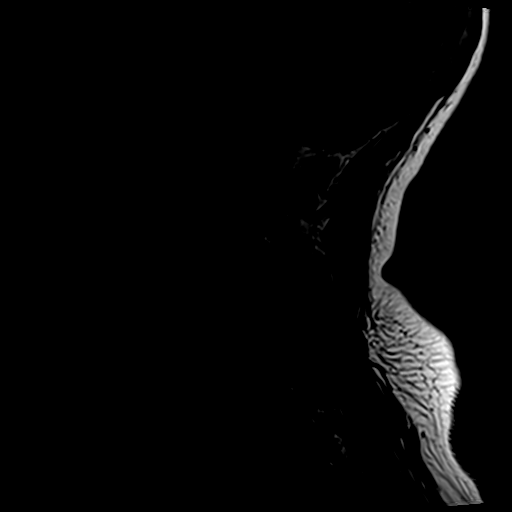
[im 13/13]
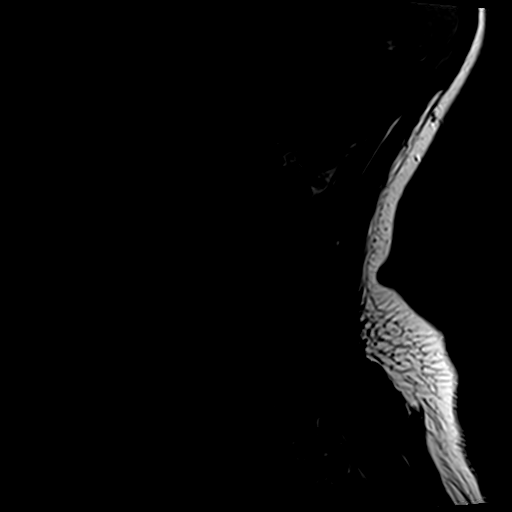

[Series 5: tir sag · sagittal · 3.0mm · 0.41mm/px · 7 of 13 slices shown]
[im 1/13]
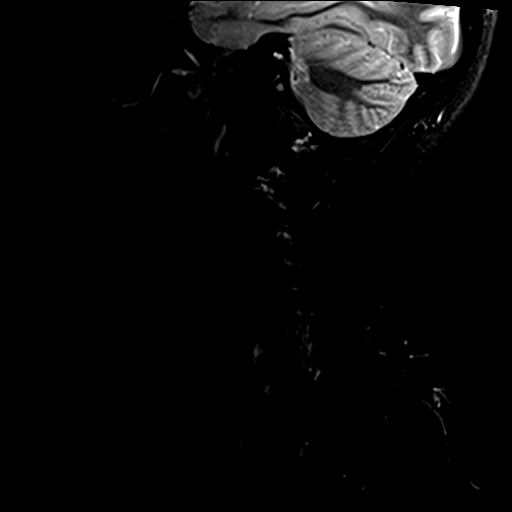
[im 3/13]
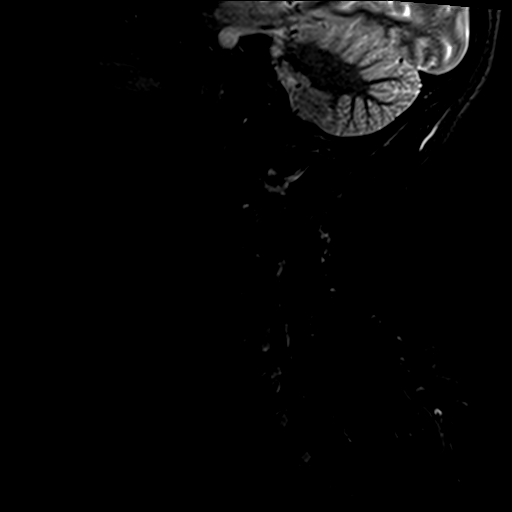
[im 5/13]
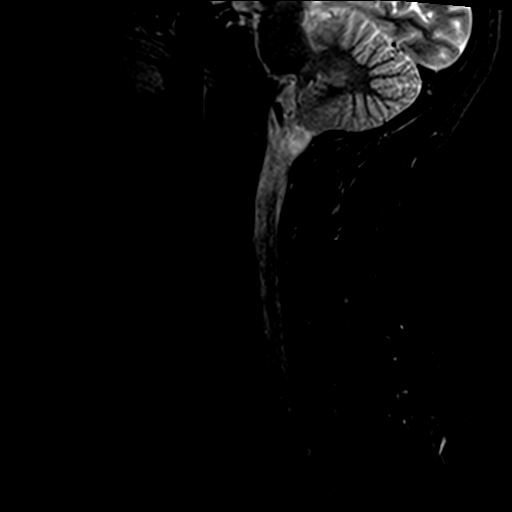
[im 7/13]
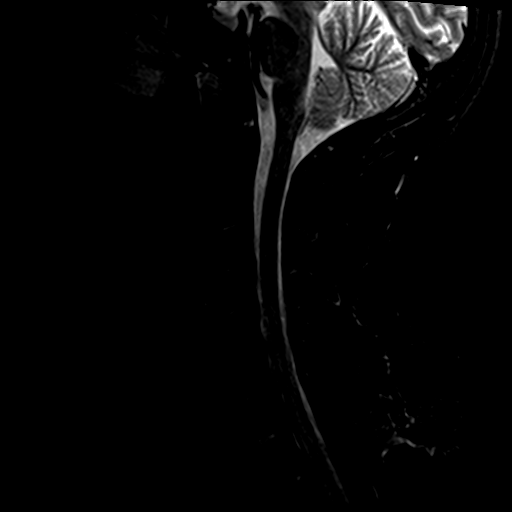
[im 9/13]
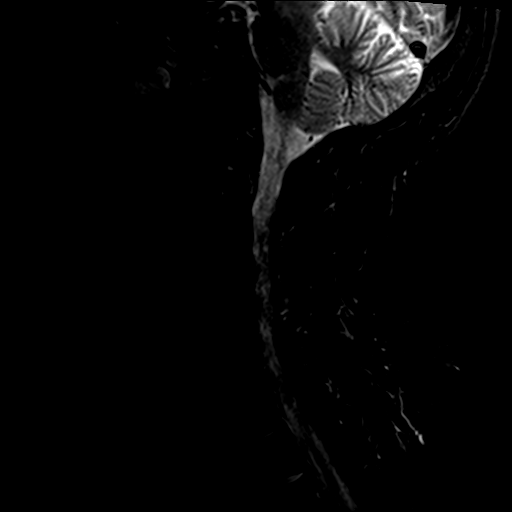
[im 11/13]
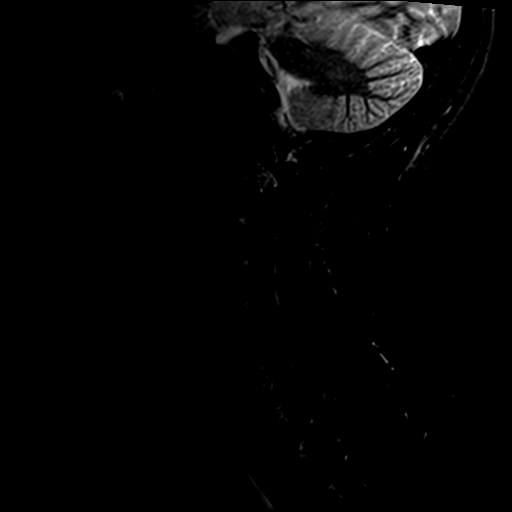
[im 13/13]
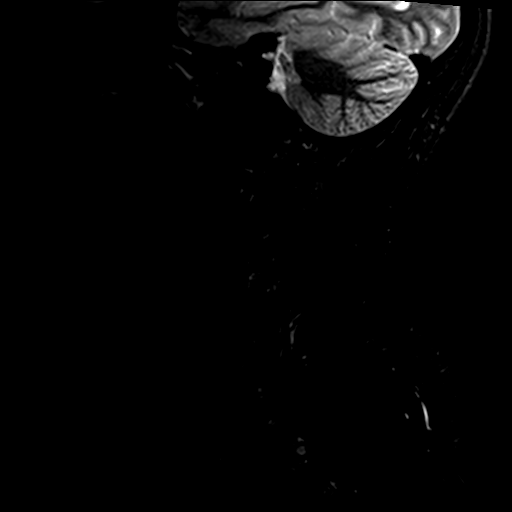

[Series 7: T2 · axial · 3.0mm · 0.70mm/px · z∈[-57,+43]mm · 8 of 28 slices shown (2 of 2)]
[im 1/28]
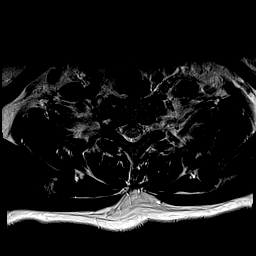
[im 5/28]
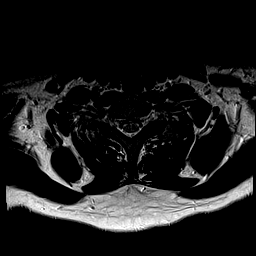
[im 9/28]
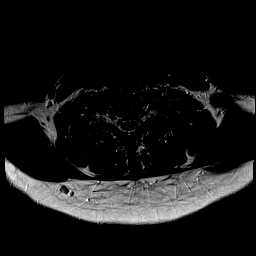
[im 13/28]
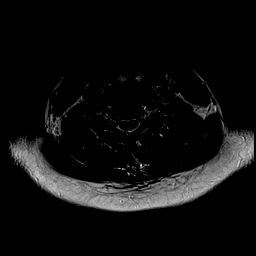
[im 15/28]
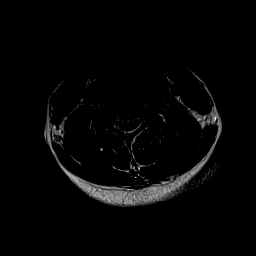
[im 19/28]
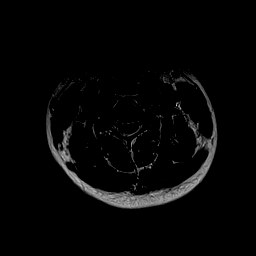
[im 23/28]
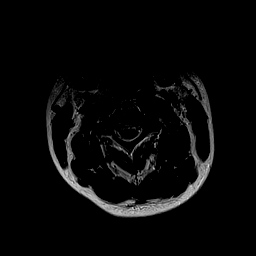
[im 28/28]
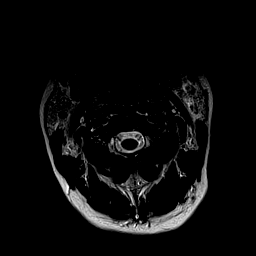

[28 of 48 positions shown; findings below may reference images not displayed]

FINDINGS: Alignment: Physiologic.

Vertebrae: No fracture, evidence of discitis, or bone lesion.

Cord: Normal signal and morphology.

Posterior Fossa, vertebral arteries, paraspinal tissues: Negative.

Disc levels:

Discs: Disc spaces are preserved.

C2-3: No significant disc bulge. No neural foraminal stenosis. No
central canal stenosis.

C3-4: No significant disc bulge. No neural foraminal stenosis. No
central canal stenosis.

C4-5: No significant disc bulge. No neural foraminal stenosis. No
central canal stenosis.

C5-6: No significant disc bulge. No neural foraminal stenosis. No
central canal stenosis.

C6-7: Mild broad-based disc bulge. No neural foraminal stenosis. No
central canal stenosis.

C7-T1: No significant disc bulge. No neural foraminal stenosis. No
central canal stenosis.
IMPRESSION: 1. At C6-7 there is a mild broad-based disc bulge. No evidence of
nerve root impingement.

## 2018-01-09 ENCOUNTER — Encounter (HOSPITAL_COMMUNITY): Payer: Self-pay | Admitting: Family Medicine

## 2018-01-09 ENCOUNTER — Ambulatory Visit (HOSPITAL_COMMUNITY)
Admission: EM | Admit: 2018-01-09 | Discharge: 2018-01-09 | Disposition: A | Discharge status: Home / Self Care | Payer: Medicaid Other | Attending: Internal Medicine | Admitting: Internal Medicine

## 2018-01-09 DIAGNOSIS — K529 Noninfective gastroenteritis and colitis, unspecified: Secondary | ICD-10-CM

## 2018-01-09 LAB — POCT URINALYSIS DIP (DEVICE)
Glucose, UA: NEGATIVE mg/dL
Leukocytes, UA: NEGATIVE
NITRITE: NEGATIVE
PH: 5.5 (ref 5.0–8.0)
PROTEIN: 30 mg/dL — AB
Specific Gravity, Urine: 1.025 (ref 1.005–1.030)
Urobilinogen, UA: 0.2 mg/dL (ref 0.0–1.0)

## 2018-01-09 LAB — POCT PREGNANCY, URINE: PREG TEST UR: NEGATIVE

## 2018-01-09 MED ORDER — ONDANSETRON 4 MG PO TBDP
ORAL_TABLET | ORAL | Status: AC
  Filled 2018-01-09: qty 1

## 2018-01-09 MED ORDER — ONDANSETRON 4 MG PO TBDP
4.0000 mg | ORAL_TABLET | Freq: Once | ORAL | Status: AC
  Administered 2018-01-09: 4 mg via ORAL

## 2018-01-09 MED ORDER — ONDANSETRON HCL 4 MG PO TABS: 4.0000 mg | ORAL_TABLET | Freq: Three times a day (TID) | ORAL | 0 refills | Status: DC | PRN
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
## 2018-01-09 NOTE — ED Triage Notes (Addendum)
Pt here for vomiting and diarrhea since 1 am. Reports that her son had flu B and her daughter had stomach virus last Sunday.

## 2018-01-09 NOTE — Discharge Instructions (Signed)
Small frequent sips of fluids to ensure adequate hydration, water, gatorade or pedialyte.  Zofran every 8 hours as needed for nausea or vomiting. I wouldn't eat food today, stick with liquids until symptoms managed. Advance to bland diet and then diet as tolerated. If symptoms worsen or do not improve in the next week to return to be seen or to follow up with your PCP.

## 2018-06-10 IMAGING — US US MFM OB FOLLOW-UP
1 series · 14 of 28 positions shown · non-contrast
Comparison: none

[Series 1: us mfm ob follow-up · 14 of 40 slices shown]
[im 2/40]
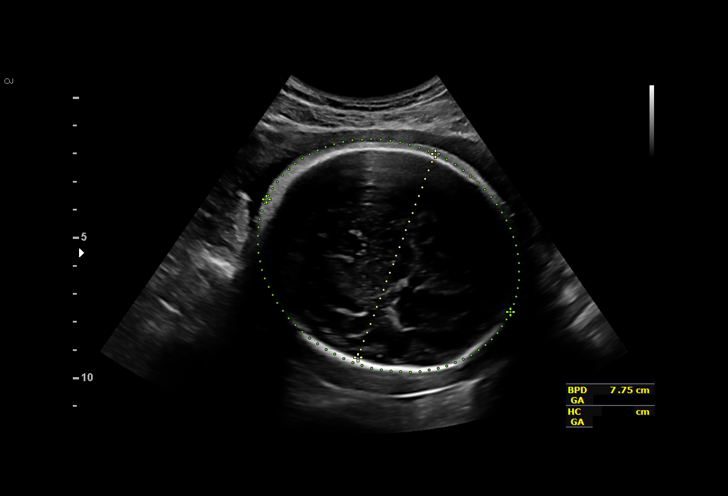
[im 5/40]
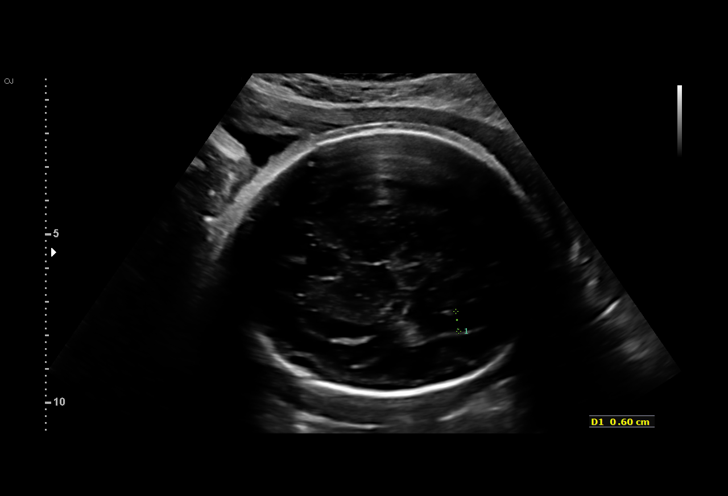
[im 8/40]
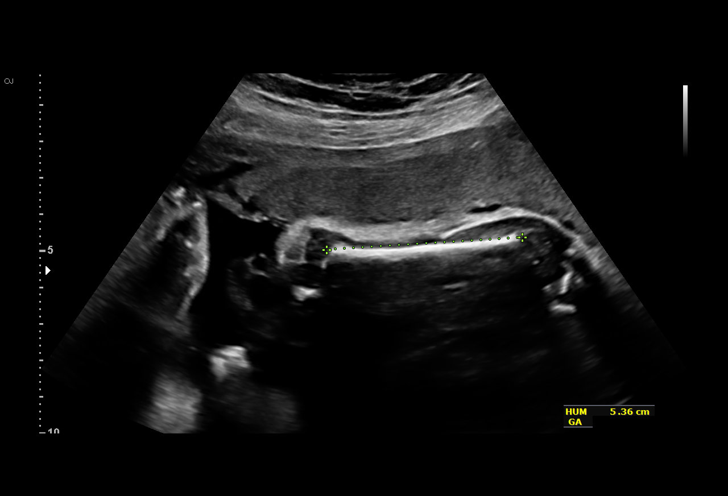
[im 11/40]
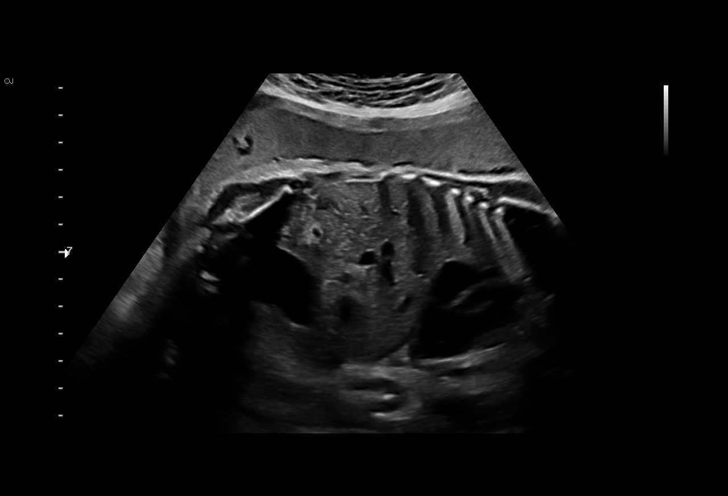
[im 14/40]
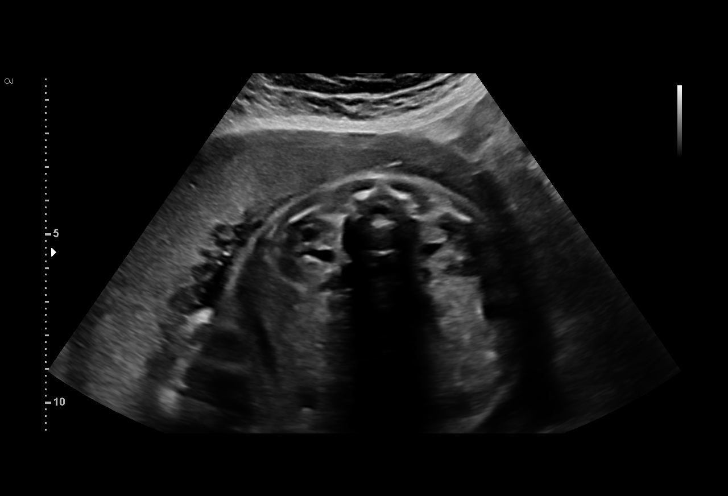
[im 16/40]
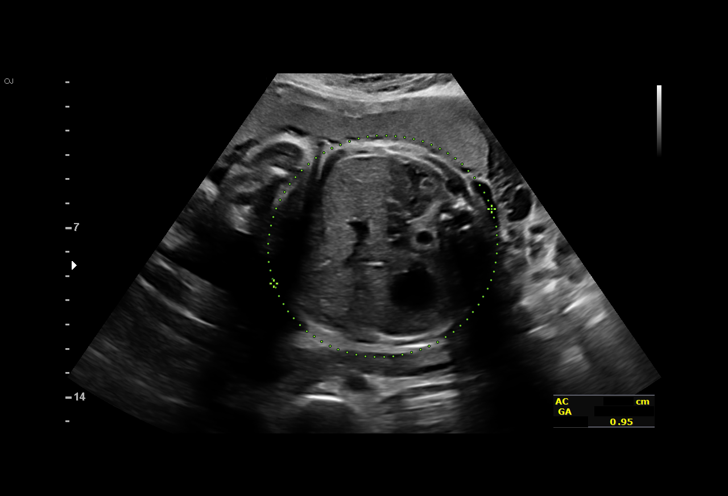
[im 19/40]
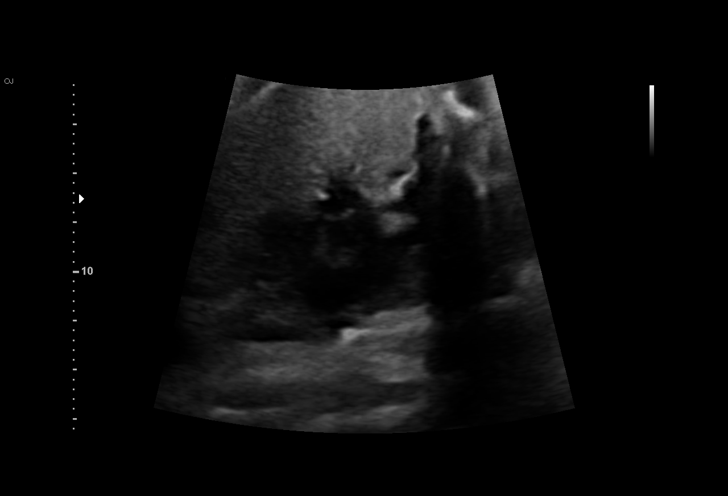
[im 22/40]
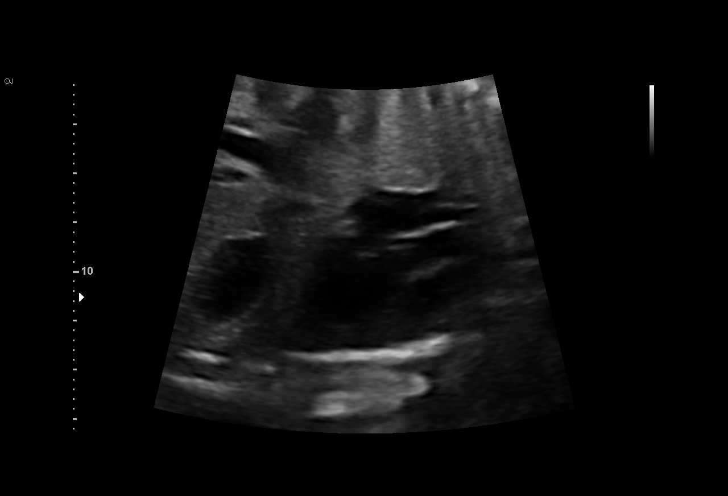
[im 25/40]
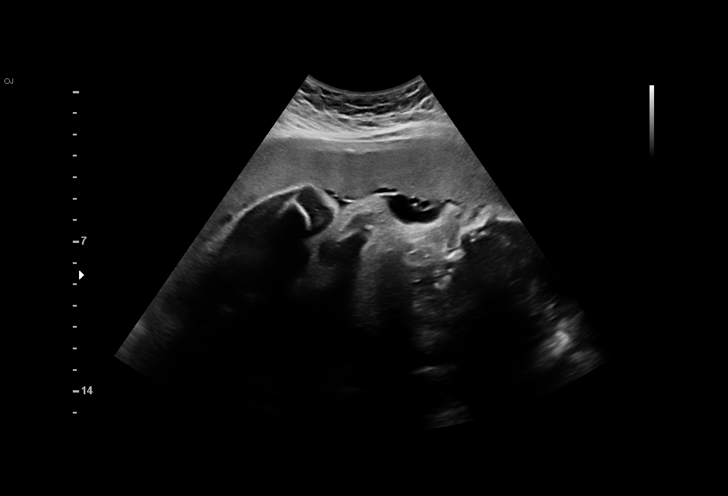
[im 28/40]
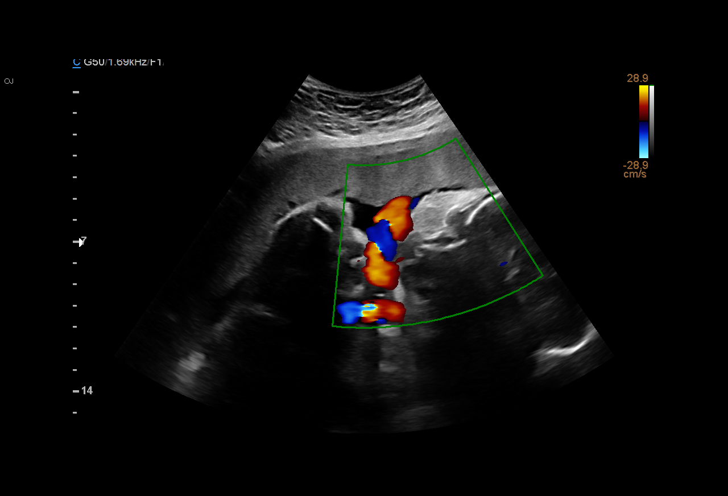
[im 31/40]
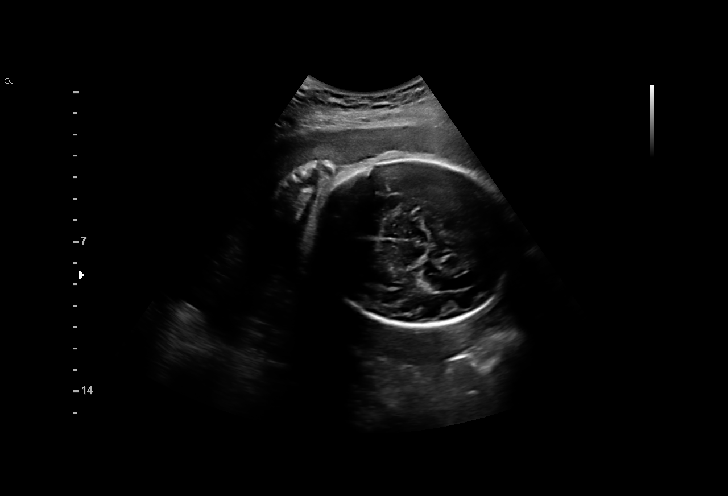
[im 34/40]
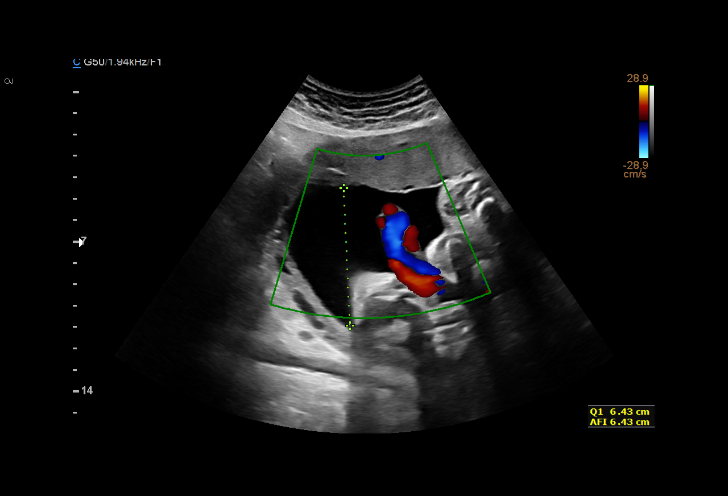
[im 37/40]
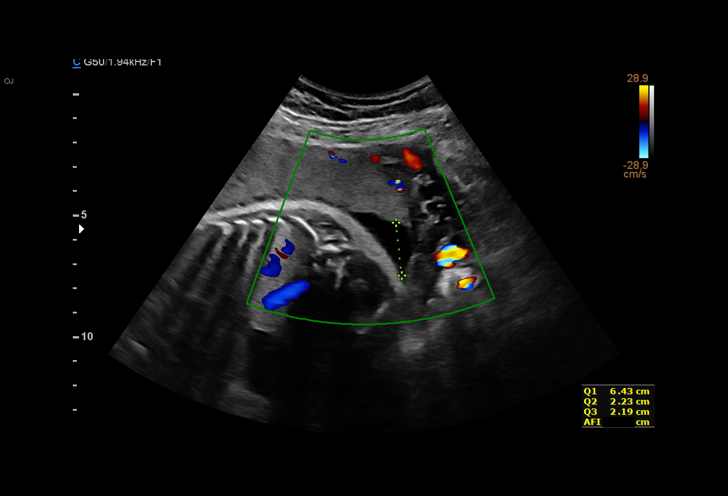
[im 40/40]
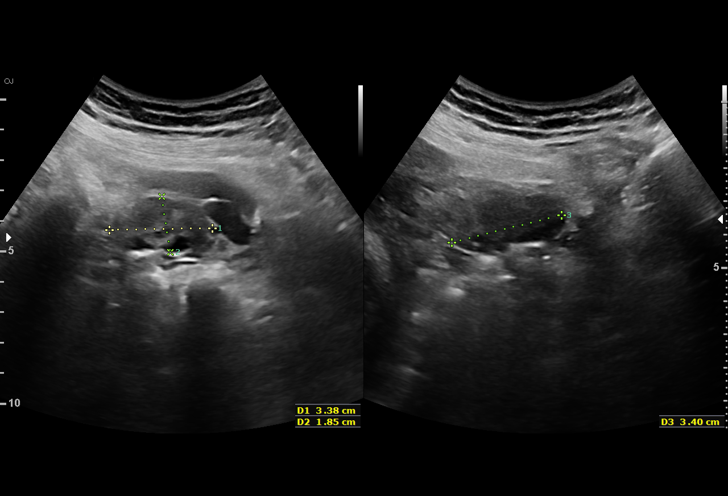

[14 of 28 positions shown; findings below may reference images not displayed]

1  KATIBE GAMSIZ            [id number]      [id number]     [id number]
Indications

30 weeks gestation of pregnancy
Pre-existing diabetes, type 2, in pregnancy,
third trimester -metformin
Poor obstetric history: Previous preterm
delivery, antepartum (29 wks)
Rh negative state in antepartum
Tobacco use complicating pregnancy, third
trimester
OB History

Gravidity:    6         Term:   2        Prem:   1        SAB:   2
TOP:          0       Ectopic:  0        Living: 3
Fetal Evaluation

Num Of Fetuses:     1
Fetal Heart         154
Rate(bpm):
Cardiac Activity:   Observed
Presentation:       Cephalic
Placenta:           Anterior, above cervical os
P. Cord Insertion:  Previously Visualized

Amniotic Fluid
AFI FV:      Subjectively within normal limits

AFI Sum(cm)     %Tile       Largest Pocket(cm)
10.85           21

RUQ(cm)                     LUQ(cm)        LLQ(cm)
6.43
Biometry

BPD:      77.3  mm     G. Age:  31w 0d         48  %    CI:        77.28   %    70 - 86
FL/HC:      21.5   %    19.3 -
HC:      278.4  mm     G. Age:  30w 3d         12  %    HC/AC:      0.97        0.96 -
AC:      288.3  mm     G. Age:  32w 6d         93  %    FL/BPD:     77.5   %    71 - 87
FL:       59.9  mm     G. Age:  31w 1d         49  %    FL/AC:      20.8   %    20 - 24
HUM:      53.3  mm     G. Age:  31w 0d         59  %

Est. FW:    4651  gm      4 lb 2 oz     76  %
Gestational Age

LMP:           30w 5d        Date:  04/03/16                 EDD:   01/08/17
U/S Today:     31w 3d                                        EDD:   01/03/17
Best:          30w 5d     Det. By:  LMP  (04/03/16)          EDD:   01/08/17
Anatomy

Cranium:               Appears normal         Aortic Arch:            Previously seen
Cavum:                 Appears normal         Ductal Arch:            Previously seen
Ventricles:            Previously seen        Diaphragm:              Appears normal
Choroid Plexus:        Previously seen        Stomach:                Appears normal, left
sided
Cerebellum:            Previously seen        Abdomen:                Appears normal
Posterior Fossa:       Previously seen        Abdominal Wall:         Previously seen
Nuchal Fold:           Previously seen        Cord Vessels:           Appears normal (3
vessel cord)
Face:                  Orbits and profile     Kidneys:                Appear normal
previously seen
Lips:                  Appears normal         Bladder:                Appears normal
Thoracic:              Appears normal         Spine:                  Limited views
previously seen
Heart:                 Previously seen        Upper Extremities:      Previously seen
RVOT:                  Appears normal         Lower Extremities:      Previously seen
LVOT:                  Appears normal

Other:  Fetus appears to be a male. Nasal bone visualized. rt heel visualized.
Cervix Uterus Adnexa

Cervix
Not visualized (advanced GA >56wks)

Uterus
No abnormality visualized.

Left Ovary
Within normal limits.

Right Ovary
Within normal limits.
Impression

Single IUP at 30w 5d
Type 2 diabetes, hx of previous preterm delivery - reports
normal fetal echo
Normal interval anatomy
Fetal growth is appropriate (76th %tile)
Anterior placenta without previa
Normal amniotic fluid volume
Recommendations

Recommend follow-up ultrasound examination in 4 weeks for
interval growth
Antenatal testing (2x weekly NSTs with weekly AFIs or
weekly BPPs) beginning at 32 weeks

## 2018-07-09 IMAGING — US US MFM OB FOLLOW-UP
1 series · 13 of 28 positions shown · non-contrast
Comparison: none

[Series 1: us mfm ob follow-up · 13 of 52 slices shown]
[im 2/52]
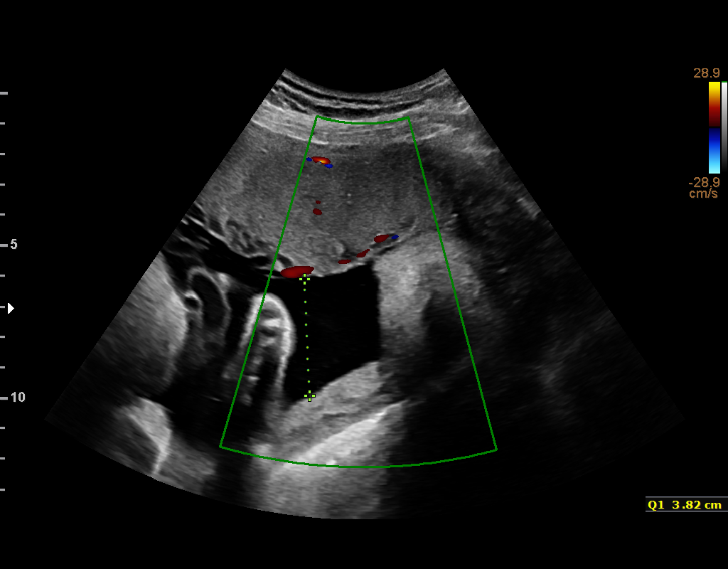
[im 6/52]
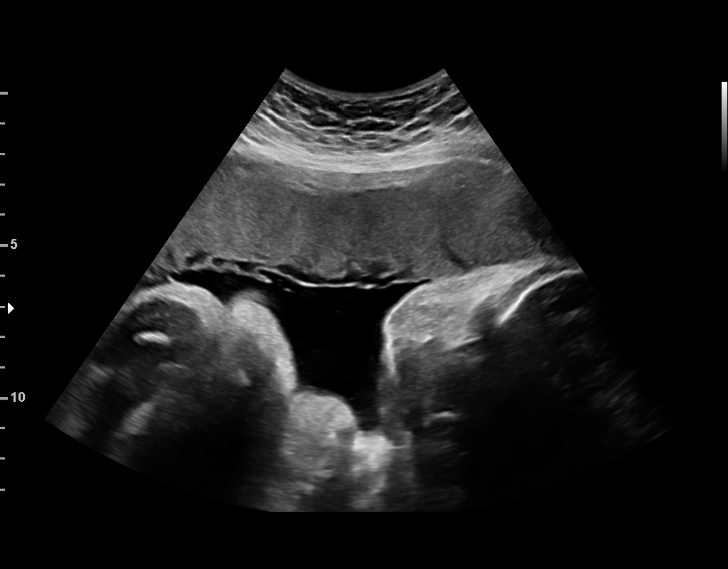
[im 10/52]
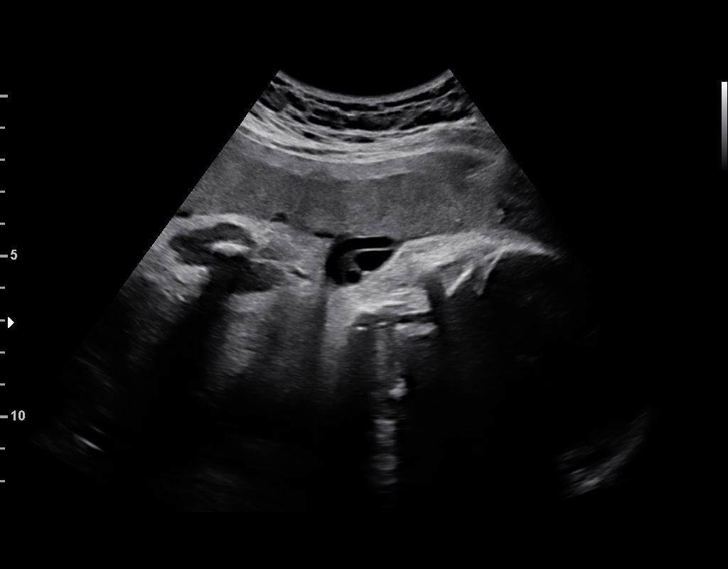
[im 14/52]
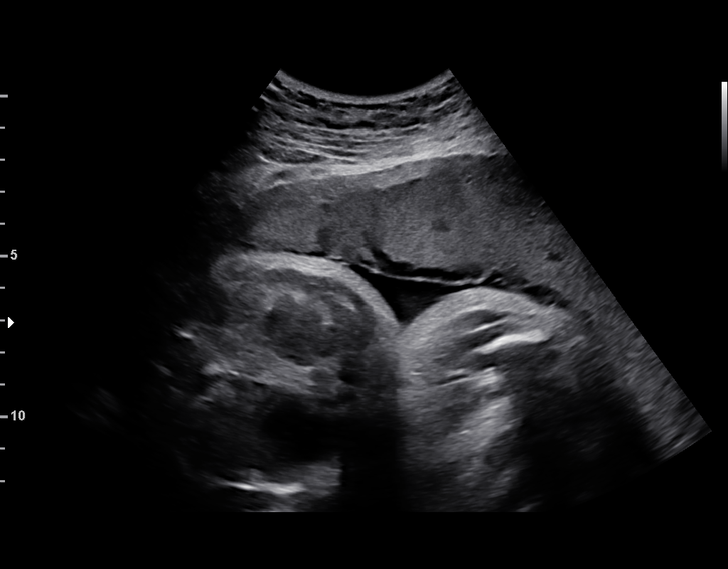
[im 18/52]
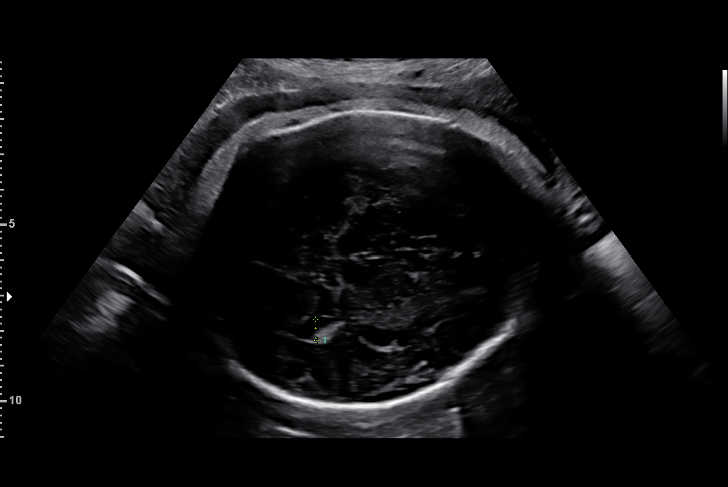
[im 21/52]
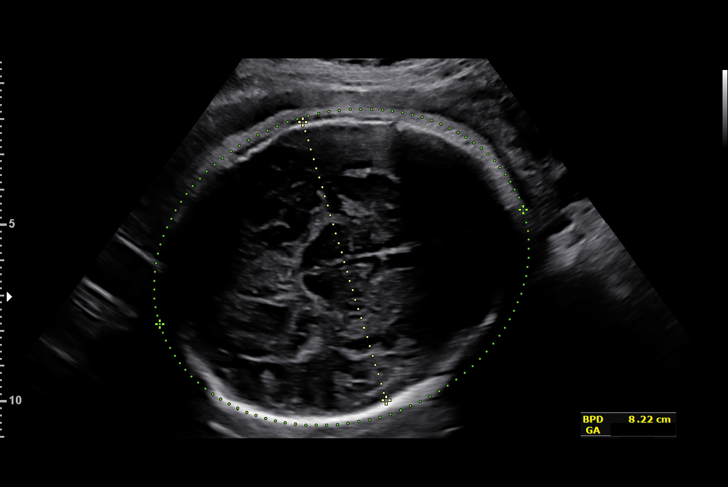
[im 27/52]
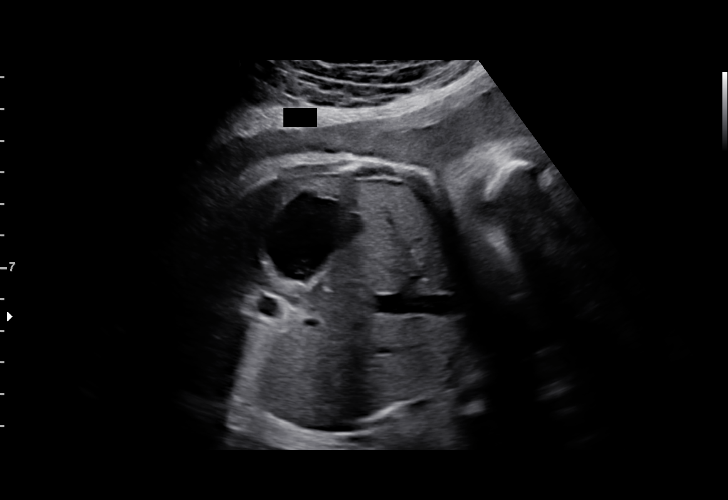
[im 31/52]
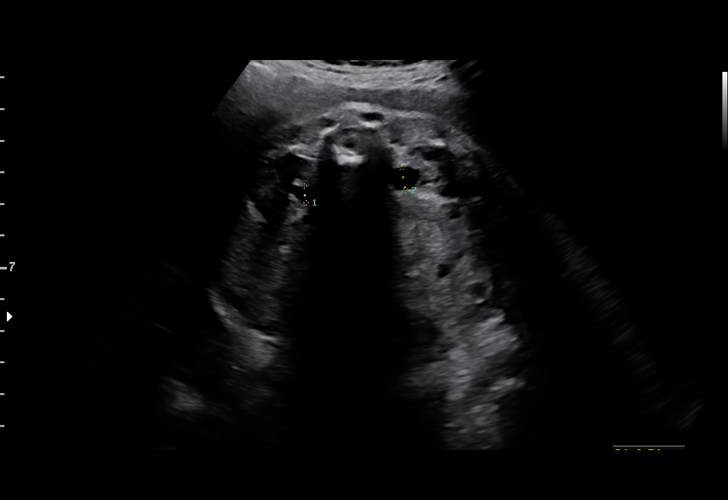
[im 35/52]
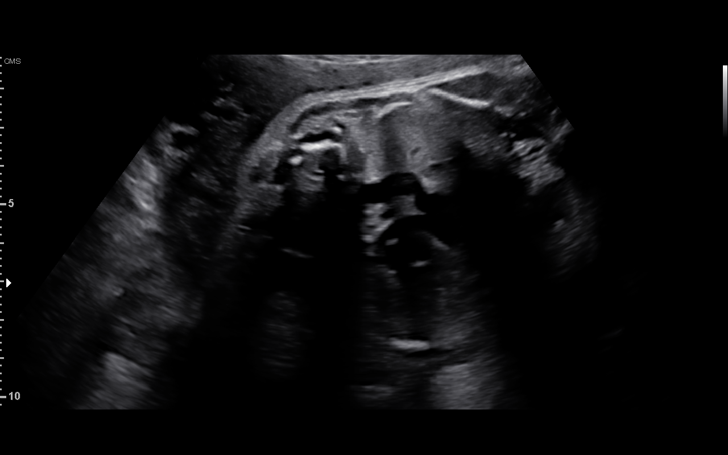
[im 38/52]
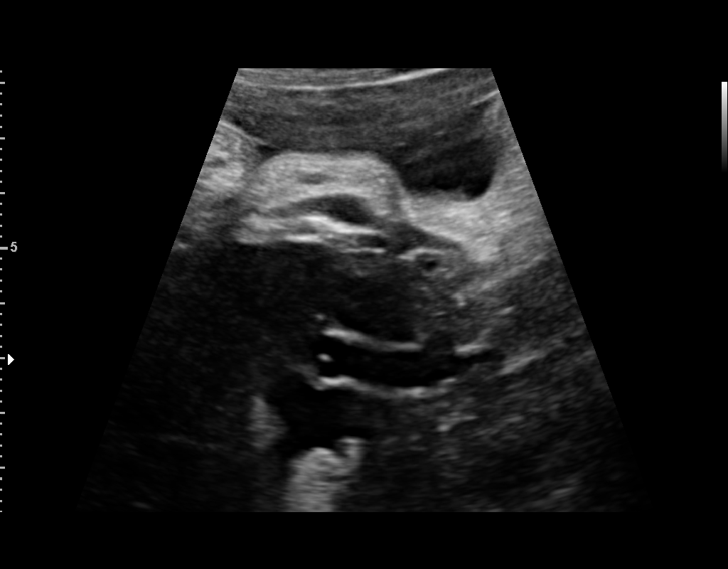
[im 42/52]
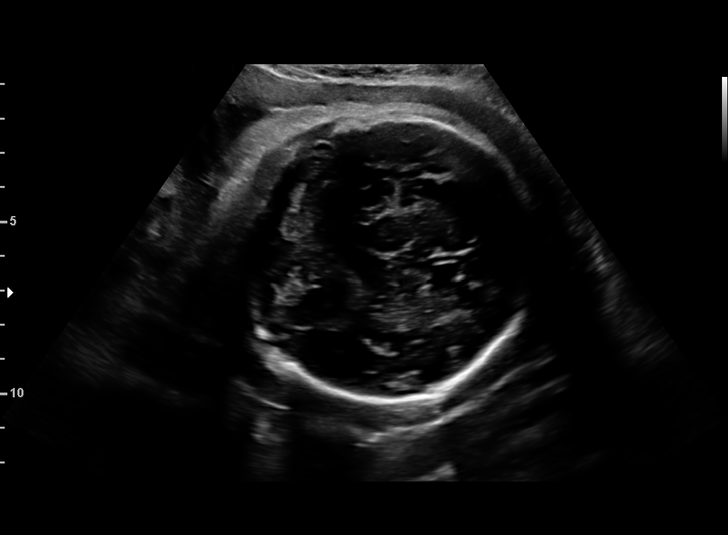
[im 46/52]
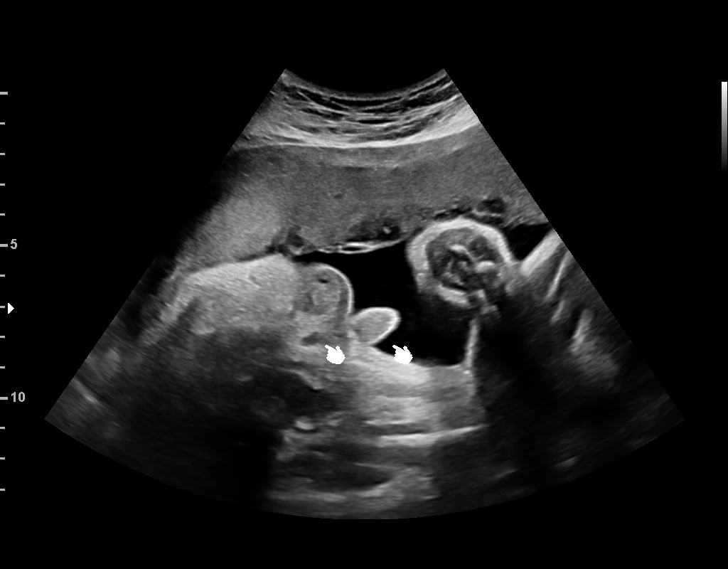
[im 50/52]
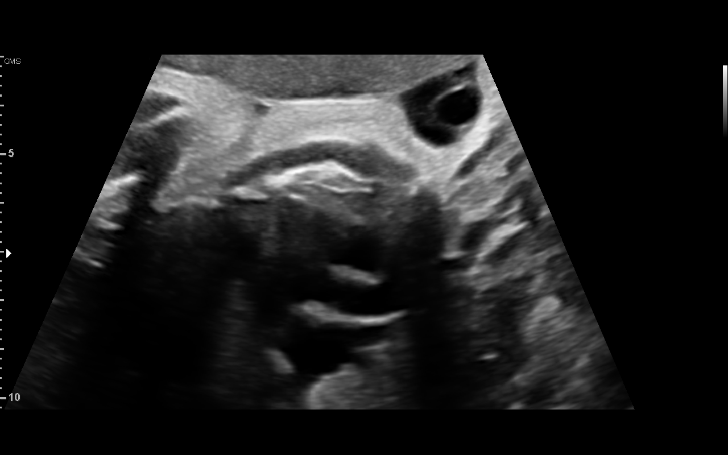

[13 of 28 positions shown; findings below may reference images not displayed]

[REDACTED]

1  BERLUS ESC            [id number]      [id number]     [id number]
Indications

34 weeks gestation of pregnancy
Poor obstetric history: Previous preterm
delivery, antepartum (29 wks)
Diabetes - Pregestational, 3rd trimester
(metformin)
Family history of congenital anomaly (son
with CHD - no surg)
OB History

Gravidity:    6         Term:   2        Prem:   1        SAB:   2
TOP:          0       Ectopic:  0        Living: 3
Fetal Evaluation

Num Of Fetuses:     1
Fetal Heart         158
Rate(bpm):
Cardiac Activity:   Observed
Presentation:       Cephalic
Placenta:           Anterior, above cervical os
P. Cord Insertion:  Visualized, central

Amniotic Fluid
AFI FV:      Subjectively within normal limits

AFI Sum(cm)     %Tile       Largest Pocket(cm)
11.48           31

RUQ(cm)                     LUQ(cm)        LLQ(cm)
3.82
Biometry

BPD:      83.2  mm     G. Age:  33w 3d         15  %    CI:        72.58   %   70 - 86
FL/HC:      21.3   %   20.1 -
HC:      310.6  mm     G. Age:  34w 5d         16  %    HC/AC:      0.97       0.93 -
AC:      320.5  mm     G. Age:  36w 0d         83  %    FL/BPD:     79.6   %   71 - 87
FL:       66.2  mm     G. Age:  34w 1d         23  %    FL/AC:      20.7   %   20 - 24
HUM:      59.3  mm     G. Age:  34w 2d         55  %

Est. FW:    8699  gm    5 lb 11 oz      66  %
Gestational Age

LMP:           34w 6d       Date:   04/03/16                 EDD:   01/08/17
U/S Today:     34w 4d                                        EDD:   01/10/17
Best:          34w 6d    Det. By:   LMP  (04/03/16)          EDD:   01/08/17
Anatomy

Cranium:               Appears normal         Aortic Arch:            Appears normal
Cavum:                 Previously seen        Ductal Arch:            Previously seen
Ventricles:            Appears normal         Diaphragm:              Appears normal
Choroid Plexus:        Appears normal         Stomach:                Appears normal, left
sided
Cerebellum:            Appears normal         Abdomen:                Appears normal
Posterior Fossa:       Previously seen        Abdominal Wall:         Previously seen
Nuchal Fold:           Previously seen        Cord Vessels:           Previously seen
Face:                  Orbits and profile     Kidneys:                Appear normal
previously seen
Lips:                  Appears normal         Bladder:                Appears normal
Thoracic:              Appears normal         Spine:                  Limited views
previously seen
Heart:                 Previously seen        Upper Extremities:      Previously seen
RVOT:                  Appears normal         Lower Extremities:      Previously seen
LVOT:                  Appears normal

Other:  Fetus appears to be a male. Nasal bone visualized. Right heel
previously visualized.
Cervix Uterus Adnexa

Cervix
Not visualized (advanced GA >36wks)

Uterus
No abnormality visualized.

Left Ovary
Not visualized.

Right Ovary
Not visualized.

Cul De Sac:   No free fluid seen.

Adnexa:       No abnormality visualized.
Impression

SIUP at 34+6 weeks
Normal interval anatomy; anatomic survey complete
Normal amniotic fluid volume
Appropriate interval growth with EFW at the 66th %tile
Recommendations

Continue twice weekly NSTs with weekly AFIs

## 2018-07-12 IMAGING — US US MFM OB LIMITED
1 series · 14 of 16 positions shown · non-contrast
Comparison: none

[Series 1: us mfm ob limited · 16 acquisitions, 14 frames shown]
[im 1/16]
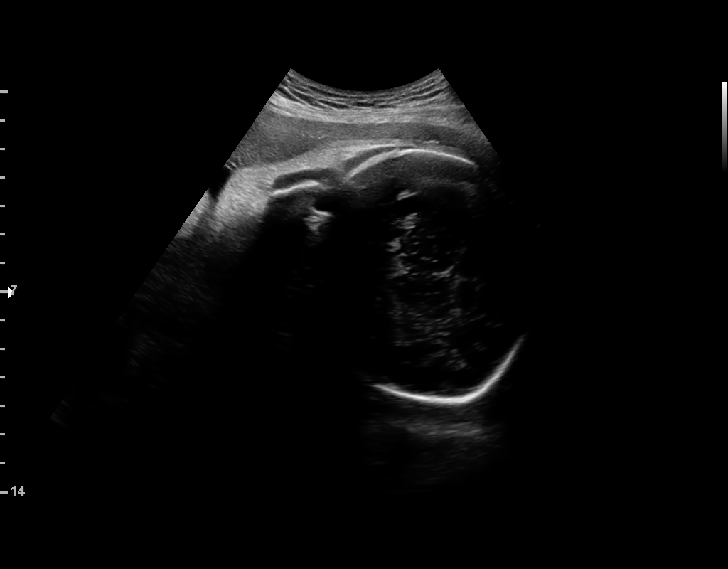
[im 2/16]
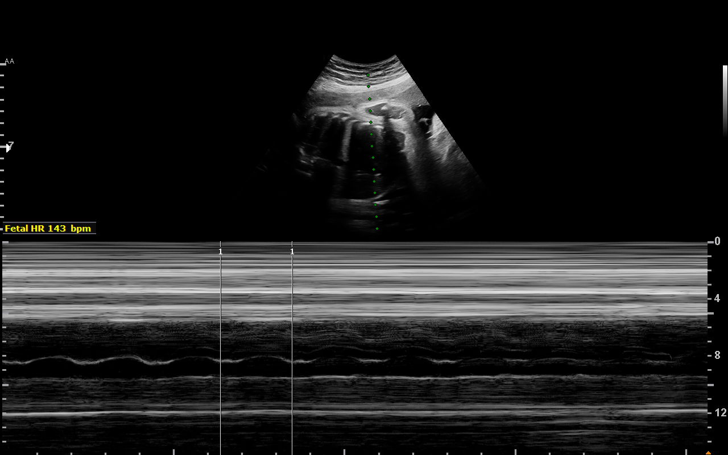
[im 3/16]
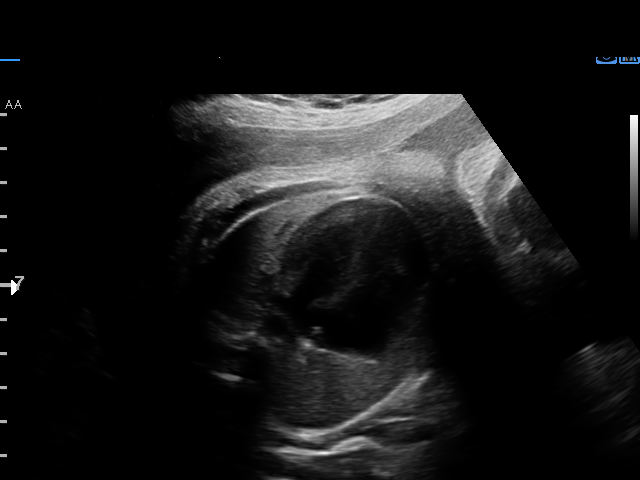
[im 5/16]
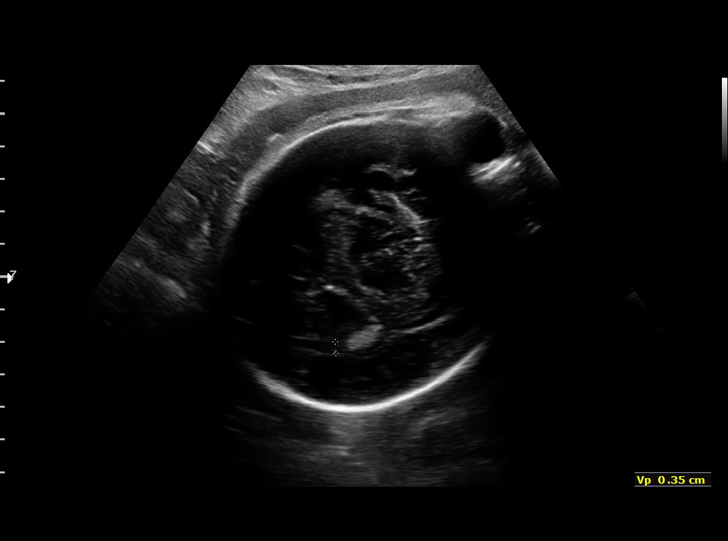
[im 6/16]
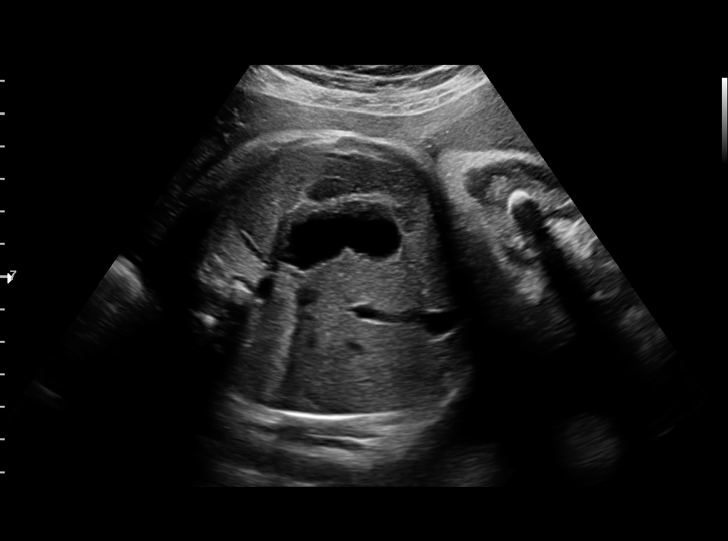
[im 7/16]
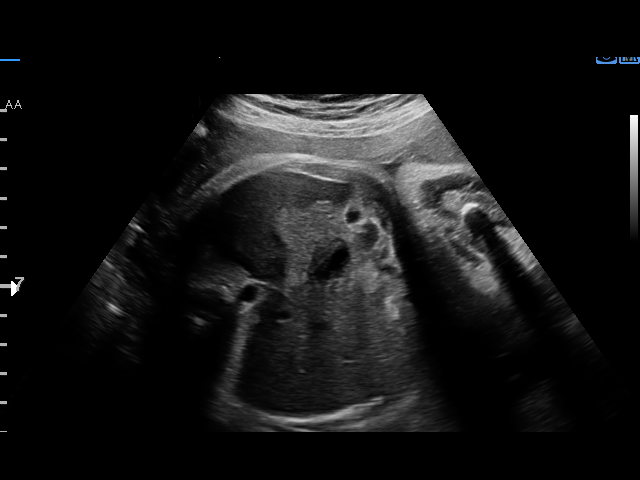
[im 8/16]
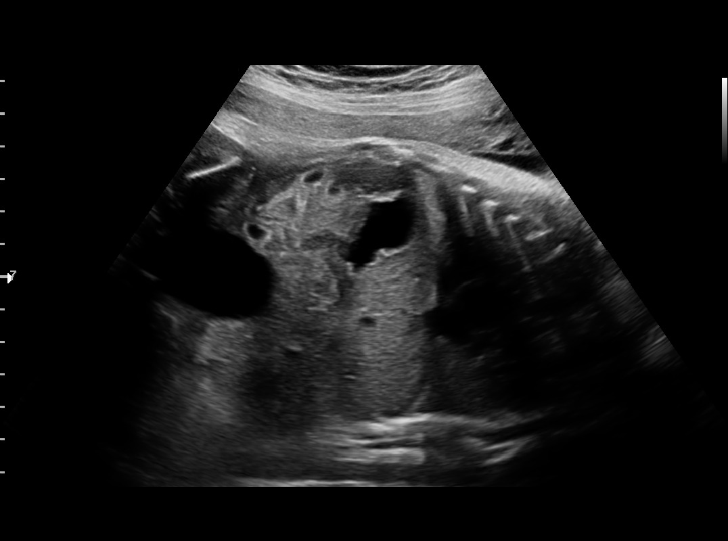
[im 9/16]
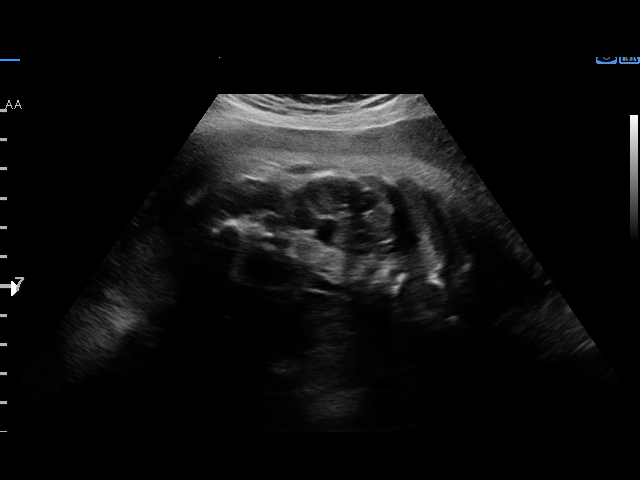
[im 10/16]
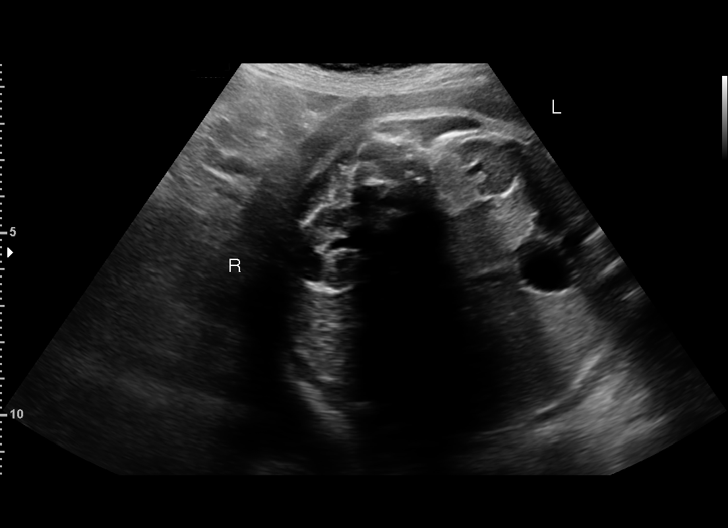
[im 11/16]
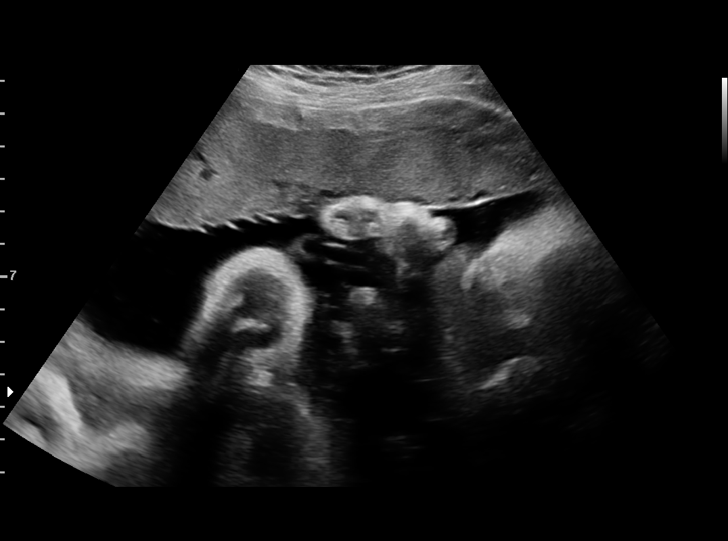
[im 13/16]
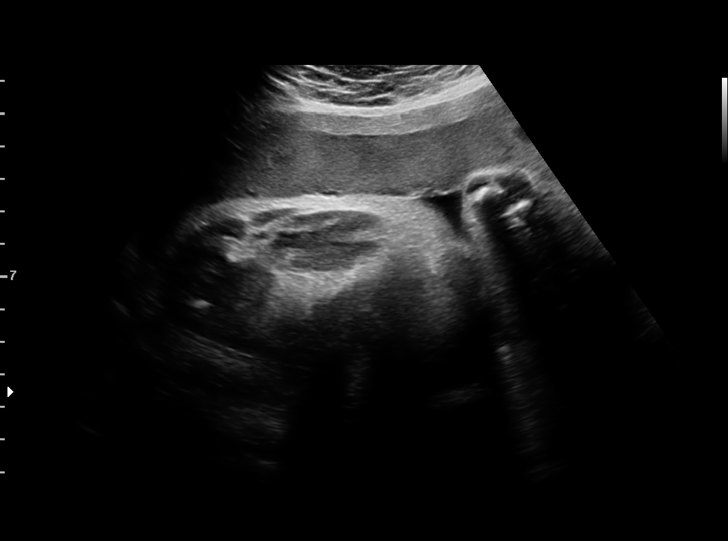
[im 14/16]
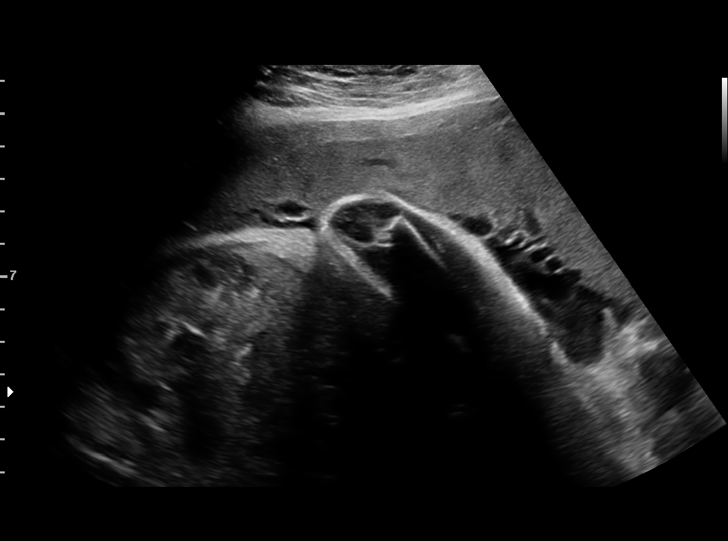
[im 15/16]
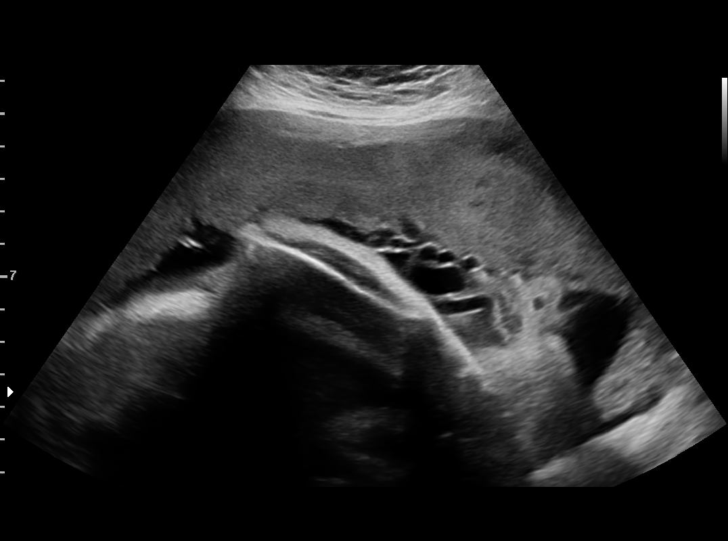
[im 16/16]
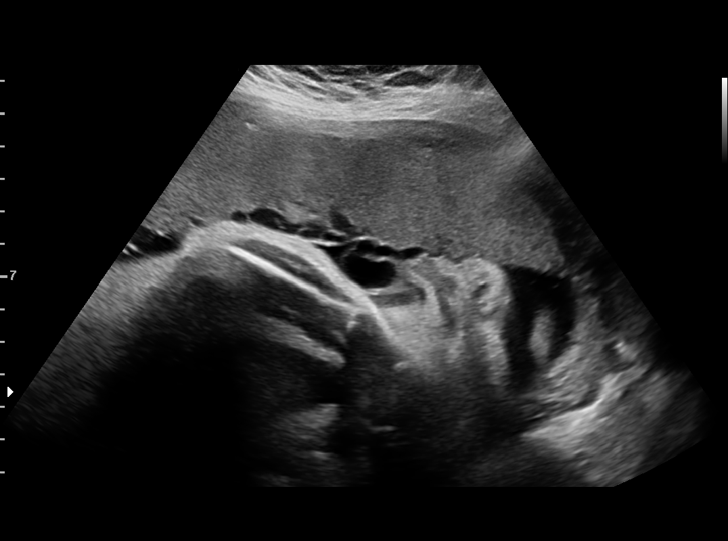

[14 of 16 positions shown; findings below may reference images not displayed]

[REDACTED]

1  CHAI TIGER            [id number]      [id number]     [id number]
Indications

35 weeks gestation of pregnancy
Poor obstetric history: Previous preterm
delivery, antepartum (29 wks)
Diabetes - Pregestational, 3rd trimester
(metformin)
Family history of congenital anomaly (son
with CHD - no surg)
OB History

Gravidity:    6         Term:   2        Prem:   1        SAB:   2
TOP:          0       Ectopic:  0        Living: 3
Fetal Evaluation

Num Of Fetuses:     1
Fetal Heart         143
Rate(bpm):
Cardiac Activity:   Observed
Presentation:       Cephalic
Placenta:           Anterior, above cervical os

Amniotic Fluid
AFI FV:      Subjectively within normal limits

AFI Sum(cm)     %Tile       Largest Pocket(cm)
10.64           26

RUQ(cm)       RLQ(cm)       LUQ(cm)        LLQ(cm)
0.73
Gestational Age

LMP:           35w 2d       Date:   04/03/16                 EDD:   01/08/17
Best:          35w 2d    Det. By:   LMP  (04/03/16)          EDD:   01/08/17
Anatomy

Cranium:               Appears normal         Aortic Arch:            Previously seen
Cavum:                 Previously seen        Ductal Arch:            Previously seen
Ventricles:            Appears normal         Diaphragm:              Appears normal
Choroid Plexus:        Appears normal         Stomach:                Appears normal, left
sided
Cerebellum:            Previously seen        Abdomen:                Previously seen
Posterior Fossa:       Previously seen        Abdominal Wall:         Previously seen
Nuchal Fold:           Previously seen        Cord Vessels:           Previously seen
Face:                  Orbits and profile     Kidneys:                Appear normal
previously seen
Lips:                  Previously seen        Bladder:                Appears normal
Thoracic:              Appears normal         Spine:                  Limited views
previously seen
Heart:                 Previously seen        Upper Extremities:      Previously seen
RVOT:                  Previously seen        Lower Extremities:      Previously seen
LVOT:                  Previously seen

Other:  Fetus appears to be a male. Nasal bone visualized. Right heel
previously visualized.
Cervix Uterus Adnexa

Cervix
Not visualized (advanced GA >54wks)
Impression

SIUP at 35+2 weeks
Cephalic presentation
Normal amniotic fluid volume
NST reactive
Recommendations

Continue twice weekly NSTs with weekly AFIs

## 2020-03-01 ENCOUNTER — Inpatient Hospital Stay (HOSPITAL_COMMUNITY)
Admission: AD | Admit: 2020-03-01 | Discharge: 2020-03-01 | Disposition: A | Discharge status: Home / Self Care | Payer: Medicaid Other | Attending: Obstetrics & Gynecology | Admitting: Obstetrics & Gynecology

## 2020-03-01 ENCOUNTER — Other Ambulatory Visit: Payer: Self-pay

## 2020-03-01 ENCOUNTER — Other Ambulatory Visit (HOSPITAL_COMMUNITY): Payer: Self-pay

## 2020-03-01 ENCOUNTER — Encounter (HOSPITAL_COMMUNITY): Payer: Self-pay | Admitting: Obstetrics & Gynecology

## 2020-03-01 ENCOUNTER — Inpatient Hospital Stay (HOSPITAL_COMMUNITY): Payer: Medicaid Other

## 2020-03-01 DIAGNOSIS — Z3A01 Less than 8 weeks gestation of pregnancy: Secondary | ICD-10-CM | POA: Diagnosis not present

## 2020-03-01 DIAGNOSIS — O99331 Smoking (tobacco) complicating pregnancy, first trimester: Secondary | ICD-10-CM | POA: Diagnosis not present

## 2020-03-01 DIAGNOSIS — R109 Unspecified abdominal pain: Secondary | ICD-10-CM | POA: Diagnosis present

## 2020-03-01 DIAGNOSIS — F17210 Nicotine dependence, cigarettes, uncomplicated: Secondary | ICD-10-CM | POA: Diagnosis not present

## 2020-03-01 DIAGNOSIS — M545 Low back pain: Secondary | ICD-10-CM | POA: Insufficient documentation

## 2020-03-01 DIAGNOSIS — O219 Vomiting of pregnancy, unspecified: Secondary | ICD-10-CM

## 2020-03-01 DIAGNOSIS — O26891 Other specified pregnancy related conditions, first trimester: Secondary | ICD-10-CM | POA: Diagnosis not present

## 2020-03-01 DIAGNOSIS — Z3491 Encounter for supervision of normal pregnancy, unspecified, first trimester: Secondary | ICD-10-CM

## 2020-03-01 LAB — CBC
HCT: 40.6 % (ref 36.0–46.0)
Hemoglobin: 13.2 g/dL (ref 12.0–15.0)
MCH: 30.3 pg (ref 26.0–34.0)
MCHC: 32.5 g/dL (ref 30.0–36.0)
MCV: 93.3 fL (ref 80.0–100.0)
Platelets: 340 10*3/uL (ref 150–400)
RBC: 4.35 MIL/uL (ref 3.87–5.11)
RDW: 12.5 % (ref 11.5–15.5)
WBC: 9.9 10*3/uL (ref 4.0–10.5)
nRBC: 0 % (ref 0.0–0.2)

## 2020-03-01 LAB — HCG, QUANTITATIVE, PREGNANCY: hCG, Beta Chain, Quant, S: 27226 m[IU]/mL — ABNORMAL HIGH (ref <5)

## 2020-03-01 LAB — WET PREP, GENITAL
Clue Cells Wet Prep HPF POC: NONE SEEN
Sperm: NONE SEEN
Trich, Wet Prep: NONE SEEN
Yeast Wet Prep HPF POC: NONE SEEN

## 2020-03-01 LAB — URINALYSIS, ROUTINE W REFLEX MICROSCOPIC
Bilirubin Urine: NEGATIVE
Glucose, UA: NEGATIVE mg/dL
Hgb urine dipstick: NEGATIVE
Ketones, ur: NEGATIVE mg/dL
Nitrite: NEGATIVE
Protein, ur: NEGATIVE mg/dL
Specific Gravity, Urine: 1.023 (ref 1.005–1.030)
pH: 6 (ref 5.0–8.0)

## 2020-03-01 LAB — POCT PREGNANCY, URINE: Preg Test, Ur: POSITIVE — AB

## 2020-03-01 MED ORDER — PROMETHAZINE HCL 12.5 MG PO TABS: 12.5000 mg | ORAL_TABLET | Freq: Four times a day (QID) | ORAL | 0 refills | Status: DC | PRN

## 2020-03-01 NOTE — [facility]U Note (Signed)
Sabrina Griffin is a 34 y.o. here in [facility]U reporting: + UPT with in the past 2 days. States she has been sick for the last 2 weeks. States she came to the MAU because she has been having sharp pains for the past 2 days. No bleeding. Also having yellow discharge, it is thick, no odor or itching.  LMP: unknown, states she has irregular periods  Onset of complaint: 2 days  Pain score: 8/10  Vitals:   03/01/20 1637  BP: (!) 121/59  Pulse: 96  Resp: 16  Temp: 98.9 F (37.2 C)  SpO2: 100%     Lab orders placed from triage: UA, UPT

## 2020-03-01 NOTE — Discharge Instructions (Signed)
First Trimester of Pregnancy The first trimester of pregnancy is from week 1 until the end of week 13 (months 1 through 3). A week after a sperm fertilizes an egg, the egg will implant on the wall of the uterus. This embryo will begin to develop into a baby. Genes from you and your partner will form the baby. The female genes will determine whether the baby will be a boy or a girl. At 6-8 weeks, the eyes and face will be formed, and the heartbeat can be seen on ultrasound. At the end of 12 weeks, all the baby's organs will be formed. Now that you are pregnant, you will want to do everything you can to have a healthy baby. Two of the most important things are to get good prenatal care and to follow your health care provider's instructions. Prenatal care is all the medical care you receive before the baby's birth. This care will help prevent, find, and treat any problems during the pregnancy and childbirth. Body changes during your first trimester Your body goes through many changes during pregnancy. The changes vary from woman to woman.  You may gain or lose a couple of pounds at first.  You may feel sick to your stomach (nauseous) and you may throw up (vomit). If the vomiting is uncontrollable, call your health care provider.  You may tire easily.  You may develop headaches that can be relieved by medicines. All medicines should be approved by your health care provider.  You may urinate more often. Painful urination may mean you have a bladder infection.  You may develop heartburn as a result of your pregnancy.  You may develop constipation because certain hormones are causing the muscles that push stool through your intestines to slow down.  You may develop hemorrhoids or swollen veins (varicose veins).  Your breasts may begin to grow larger and become tender. Your nipples may stick out more, and the tissue that surrounds them (areola) may become darker.  Your gums may bleed and may be  sensitive to brushing and flossing.  Dark spots or blotches (chloasma, mask of pregnancy) may develop on your face. This will likely fade after the baby is born.  Your menstrual periods will stop.  You may have a loss of appetite.  You may develop cravings for certain kinds of food.  You may have changes in your emotions from day to day, such as being excited to be pregnant or being concerned that something may go wrong with the pregnancy and baby.  You may have more vivid and strange dreams.  You may have changes in your hair. These can include thickening of your hair, rapid growth, and changes in texture. Some women also have hair loss during or after pregnancy, or hair that feels dry or thin. Your hair will most likely return to normal after your baby is born. What to expect at prenatal visits During a routine prenatal visit:  You will be weighed to make sure you and the baby are growing normally.  Your blood pressure will be taken.  Your abdomen will be measured to track your baby's growth.  The fetal heartbeat will be listened to between weeks 10 and 14 of your pregnancy.  Test results from any previous visits will be discussed. Your health care provider may ask you:  How you are feeling.  If you are feeling the baby move.  If you have had any abnormal symptoms, such as leaking fluid, bleeding, severe headaches, or abdominal   cramping.  If you are using any tobacco products, including cigarettes, chewing tobacco, and electronic cigarettes.  If you have any questions. Other tests that may be performed during your first trimester include:  Blood tests to find your blood type and to check for the presence of any previous infections. The tests will also be used to check for low iron levels (anemia) and protein on red blood cells (Rh antibodies). Depending on your risk factors, or if you previously had diabetes during pregnancy, you may have tests to check for high blood sugar  that affects pregnant women (gestational diabetes).  Urine tests to check for infections, diabetes, or protein in the urine.  An ultrasound to confirm the proper growth and development of the baby.  Fetal screens for spinal cord problems (spina bifida) and Down syndrome.  HIV (human immunodeficiency virus) testing. Routine prenatal testing includes screening for HIV, unless you choose not to have this test.  You may need other tests to make sure you and the baby are doing well. Follow these instructions at home: Medicines  Follow your health care provider's instructions regarding medicine use. Specific medicines may be either safe or unsafe to take during pregnancy.  Take a prenatal vitamin that contains at least 600 micrograms (mcg) of folic acid.  If you develop constipation, try taking a stool softener if your health care provider approves. Eating and drinking   Eat a balanced diet that includes fresh fruits and vegetables, whole grains, good sources of protein such as meat, eggs, or tofu, and low-fat dairy. Your health care provider will help you determine the amount of weight gain that is right for you.  Avoid raw meat and uncooked cheese. These carry germs that can cause birth defects in the baby.  Eating four or five small meals rather than three large meals a day may help relieve nausea and vomiting. If you start to feel nauseous, eating a few soda crackers can be helpful. Drinking liquids between meals, instead of during meals, also seems to help ease nausea and vomiting.  Limit foods that are high in fat and processed sugars, such as fried and sweet foods.  To prevent constipation: ? Eat foods that are high in fiber, such as fresh fruits and vegetables, whole grains, and beans. ? Drink enough fluid to keep your urine clear or pale yellow. Activity  Exercise only as directed by your health care provider. Most women can continue their usual exercise routine during  pregnancy. Try to exercise for 30 minutes at least 5 days a week. Exercising will help you: ? Control your weight. ? Stay in shape. ? Be prepared for labor and delivery.  Experiencing pain or cramping in the lower abdomen or lower back is a good sign that you should stop exercising. Check with your health care provider before continuing with normal exercises.  Try to avoid standing for long periods of time. Move your legs often if you must stand in one place for a long time.  Avoid heavy lifting.  Wear low-heeled shoes and practice good posture.  You may continue to have sex unless your health care provider tells you not to. Relieving pain and discomfort  Wear a good support bra to relieve breast tenderness.  Take warm sitz baths to soothe any pain or discomfort caused by hemorrhoids. Use hemorrhoid cream if your health care provider approves.  Rest with your legs elevated if you have leg cramps or low back pain.  If you develop varicose veins in   your legs, wear support hose. Elevate your feet for 15 minutes, 3-4 times a day. Limit salt in your diet. Prenatal care  Schedule your prenatal visits by the twelfth week of pregnancy. They are usually scheduled monthly at first, then more often in the last 2 months before delivery.  Write down your questions. Take them to your prenatal visits.  Keep all your prenatal visits as told by your health care provider. This is important. Safety  Wear your seat belt at all times when driving.  Make a list of emergency phone numbers, including numbers for family, friends, the hospital, and police and fire departments. General instructions  Ask your health care provider for a referral to a local prenatal education class. Begin classes no later than the beginning of month 6 of your pregnancy.  Ask for help if you have counseling or nutritional needs during pregnancy. Your health care provider can offer advice or refer you to specialists for help  with various needs.  Do not use hot tubs, steam rooms, or saunas.  Do not douche or use tampons or scented sanitary pads.  Do not cross your legs for long periods of time.  Avoid cat litter boxes and soil used by cats. These carry germs that can cause birth defects in the baby and possibly loss of the fetus by miscarriage or stillbirth.  Avoid all smoking, herbs, alcohol, and medicines not prescribed by your health care provider. Chemicals in these products affect the formation and growth of the baby.  Do not use any products that contain nicotine or tobacco, such as cigarettes and e-cigarettes. If you need help quitting, ask your health care provider. You may receive counseling support and other resources to help you quit.  Schedule a dentist appointment. At home, brush your teeth with a soft toothbrush and be gentle when you floss. Contact a health care provider if:  You have dizziness.  You have mild pelvic cramps, pelvic pressure, or nagging pain in the abdominal area.  You have persistent nausea, vomiting, or diarrhea.  You have a bad smelling vaginal discharge.  You have pain when you urinate.  You notice increased swelling in your face, hands, legs, or ankles.  You are exposed to fifth disease or chickenpox.  You are exposed to German measles (rubella) and have never had it. Get help right away if:  You have a fever.  You are leaking fluid from your vagina.  You have spotting or bleeding from your vagina.  You have severe abdominal cramping or pain.  You have rapid weight gain or loss.  You vomit blood or material that looks like coffee grounds.  You develop a severe headache.  You have shortness of breath.  You have any kind of trauma, such as from a fall or a car accident. Summary  The first trimester of pregnancy is from week 1 until the end of week 13 (months 1 through 3).  Your body goes through many changes during pregnancy. The changes vary from  woman to woman.  You will have routine prenatal visits. During those visits, your health care provider will examine you, discuss any test results you may have, and talk with you about how you are feeling. This information is not intended to replace advice given to you by your health care provider. Make sure you discuss any questions you have with your health care provider. Document Revised: 10/31/2017 Document Reviewed: 10/30/2016 [employer] Patient Education  2020 [employer]  
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
## 2020-03-02 LAB — GC/CHLAMYDIA PROBE AMP (~~LOC~~) NOT AT ARMC
Chlamydia: NEGATIVE
Comment: NEGATIVE
Comment: NORMAL
Neisseria Gonorrhea: NEGATIVE

## 2020-03-02 LAB — CULTURE, OB URINE: Culture: NO GROWTH

## 2020-03-08 ENCOUNTER — Other Ambulatory Visit: Payer: Self-pay | Admitting: *Deleted

## 2020-03-08 DIAGNOSIS — O219 Vomiting of pregnancy, unspecified: Secondary | ICD-10-CM

## 2020-03-08 MED ORDER — DOXYLAMINE-PYRIDOXINE 10-10 MG PO TBEC: DELAYED_RELEASE_TABLET | ORAL | 5 refills | Status: DC

## 2020-03-08 NOTE — Progress Notes (Signed)
Pt called to office stating that she has had no relief with use of Phenergan and was told she could get another Rx. Diclegis was sent today per protocol. Advised pt to call office if problems or no relief in symptoms.  Pt states that she is not able to keep down any solids or liquids.  Pt states she has lost 6 pounds so far this pregnancy. Pt is trying to tolerate protein shakes.  Pt states she has also been dry heaving as of today. Pt states she is begining to not feel well- feeling weak, cramping and having pains.  Pt advised if symptoms continue and she cannot tolerate any fluids she should be seen at hospital again for eval.  Pt made aware she may need IV fluids for hydration and possible antiemetics.  Pt states she lives in [place] and ask if she could be seen at [facility] since it is closer. Pt made aware that she can be seen anywhere she chooses but she may want to be seen at [facility] as they are part of [facility] and still closer to her.  Pt states understanding.

## 2020-03-16 ENCOUNTER — Other Ambulatory Visit: Payer: Self-pay

## 2020-03-16 ENCOUNTER — Encounter (HOSPITAL_COMMUNITY): Payer: Self-pay | Admitting: Obstetrics & Gynecology

## 2020-03-16 ENCOUNTER — Inpatient Hospital Stay (HOSPITAL_COMMUNITY)
Admission: AD | Admit: 2020-03-16 | Discharge: 2020-03-16 | Discharge status: Against Medical Advice | Payer: Medicaid Other | Source: Ambulatory Visit | Attending: Obstetrics & Gynecology | Admitting: Obstetrics & Gynecology

## 2020-03-16 DIAGNOSIS — R197 Diarrhea, unspecified: Secondary | ICD-10-CM | POA: Insufficient documentation

## 2020-03-16 DIAGNOSIS — O99331 Smoking (tobacco) complicating pregnancy, first trimester: Secondary | ICD-10-CM | POA: Diagnosis not present

## 2020-03-16 DIAGNOSIS — F17210 Nicotine dependence, cigarettes, uncomplicated: Secondary | ICD-10-CM | POA: Diagnosis not present

## 2020-03-16 DIAGNOSIS — E118 Type 2 diabetes mellitus with unspecified complications: Secondary | ICD-10-CM | POA: Diagnosis not present

## 2020-03-16 DIAGNOSIS — O24111 Pre-existing diabetes mellitus, type 2, in pregnancy, first trimester: Secondary | ICD-10-CM | POA: Diagnosis not present

## 2020-03-16 DIAGNOSIS — O219 Vomiting of pregnancy, unspecified: Secondary | ICD-10-CM

## 2020-03-16 DIAGNOSIS — Z79899 Other long term (current) drug therapy: Secondary | ICD-10-CM | POA: Insufficient documentation

## 2020-03-16 DIAGNOSIS — O26891 Other specified pregnancy related conditions, first trimester: Secondary | ICD-10-CM | POA: Diagnosis present

## 2020-03-16 DIAGNOSIS — Z3A08 8 weeks gestation of pregnancy: Secondary | ICD-10-CM | POA: Diagnosis not present

## 2020-03-16 DIAGNOSIS — R109 Unspecified abdominal pain: Secondary | ICD-10-CM | POA: Diagnosis not present

## 2020-03-16 LAB — RH IG WORKUP (INCLUDES ABO/RH)
ABO/RH(D): O NEG
Antibody Screen: NEGATIVE
Gestational Age(Wks): 8.2
Unit division: 0

## 2020-03-16 LAB — URINALYSIS, ROUTINE W REFLEX MICROSCOPIC
Bilirubin Urine: NEGATIVE
Glucose, UA: NEGATIVE mg/dL
Ketones, ur: NEGATIVE mg/dL
Leukocytes,Ua: NEGATIVE
Nitrite: NEGATIVE
Protein, ur: 30 mg/dL — AB
Specific Gravity, Urine: 1.023 (ref 1.005–1.030)
pH: 5 (ref 5.0–8.0)

## 2020-03-16 LAB — CBC WITH DIFFERENTIAL/PLATELET
Abs Immature Granulocytes: 0.06 10*3/uL (ref 0.00–0.07)
Basophils Absolute: 0 10*3/uL (ref 0.0–0.1)
Basophils Relative: 0 %
Eosinophils Absolute: 0 10*3/uL (ref 0.0–0.5)
Eosinophils Relative: 0 %
HCT: 38.6 % (ref 36.0–46.0)
Hemoglobin: 12.6 g/dL (ref 12.0–15.0)
Immature Granulocytes: 1 %
Lymphocytes Relative: 19 %
Lymphs Abs: 2 10*3/uL (ref 0.7–4.0)
MCH: 30.4 pg (ref 26.0–34.0)
MCHC: 32.6 g/dL (ref 30.0–36.0)
MCV: 93 fL (ref 80.0–100.0)
Monocytes Absolute: 0.6 10*3/uL (ref 0.1–1.0)
Monocytes Relative: 6 %
Neutro Abs: 8.1 10*3/uL — ABNORMAL HIGH (ref 1.7–7.7)
Neutrophils Relative %: 74 %
Platelets: 312 10*3/uL (ref 150–400)
RBC: 4.15 MIL/uL (ref 3.87–5.11)
RDW: 12.3 % (ref 11.5–15.5)
WBC: 10.9 10*3/uL — ABNORMAL HIGH (ref 4.0–10.5)
nRBC: 0 % (ref 0.0–0.2)

## 2020-03-16 LAB — COMPREHENSIVE METABOLIC PANEL
ALT: 13 U/L (ref 0–44)
AST: 14 U/L — ABNORMAL LOW (ref 15–41)
Albumin: 4.1 g/dL (ref 3.5–5.0)
Alkaline Phosphatase: 44 U/L (ref 38–126)
Anion gap: 10 (ref 5–15)
BUN: 6 mg/dL (ref 6–20)
CO2: 23 mmol/L (ref 22–32)
Calcium: 9 mg/dL (ref 8.9–10.3)
Chloride: 104 mmol/L (ref 98–111)
Creatinine, Ser: 0.61 mg/dL (ref 0.44–1.00)
GFR calc Af Amer: >60 mL/min (ref >60)
GFR calc non Af Amer: >60 mL/min (ref >60)
Glucose, Bld: 87 mg/dL (ref 70–99)
Potassium: 3.9 mmol/L (ref 3.5–5.1)
Sodium: 137 mmol/L (ref 135–145)
Total Bilirubin: 0.5 mg/dL (ref 0.3–1.2)
Total Protein: 6.6 g/dL (ref 6.5–8.1)

## 2020-03-16 LAB — WET PREP, GENITAL
Clue Cells Wet Prep HPF POC: NONE SEEN
Sperm: NONE SEEN
Trich, Wet Prep: NONE SEEN
Yeast Wet Prep HPF POC: NONE SEEN

## 2020-03-16 MED ORDER — METOCLOPRAMIDE HCL 5 MG/ML IJ SOLN
10.0000 mg | Freq: Once | INTRAMUSCULAR | Status: AC
  Administered 2020-03-16: 20:00:00 10 mg via INTRAVENOUS
  Filled 2020-03-16: qty 2

## 2020-03-16 MED ORDER — FAMOTIDINE IN NACL 20-0.9 MG/50ML-% IV SOLN
20.0000 mg | Freq: Once | INTRAVENOUS | Status: AC
  Administered 2020-03-16: 20:00:00 20 mg via INTRAVENOUS
  Filled 2020-03-16: qty 50

## 2020-03-16 MED ORDER — LACTATED RINGERS IV BOLUS
1000.0000 mL | Freq: Once | INTRAVENOUS | Status: AC
  Administered 2020-03-16: 20:00:00 1000 mL via INTRAVENOUS
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
## 2020-03-16 NOTE — MAU Note (Signed)
Called her dr, was told to come.  Hasn't really been able to eat or drink since Sat.  Having some really sharp pains underneath her belly, started at upper abd.  Has had some light pink d/c.  Has had a loose/watery stool once a day, only happening at night, this started after she started taking the nausea meds

## 2020-03-17 LAB — CULTURE, OB URINE

## 2020-03-17 LAB — GC/CHLAMYDIA PROBE AMP (~~LOC~~) NOT AT ARMC
Chlamydia: NEGATIVE
Comment: NEGATIVE
Comment: NORMAL
Neisseria Gonorrhea: NEGATIVE

## 2020-03-28 ENCOUNTER — Ambulatory Visit: Payer: Medicaid Other | Admitting: *Deleted

## 2020-03-28 DIAGNOSIS — Z3480 Encounter for supervision of other normal pregnancy, unspecified trimester: Secondary | ICD-10-CM | POA: Insufficient documentation

## 2020-03-28 NOTE — Progress Notes (Signed)
PRENATAL INTAKE SUMMARY  Sabrina Griffin presents today New OB Nurse Interview.  OB History    Gravida  7   Para  4   Term  3   Preterm  1   AB  2   Living  4     SAB  2   TAB      Ectopic      Multiple  0   Live Births  4          I have reviewed the patient's medical, obstetrical, social, and family histories, medications, and available lab results.  SUBJECTIVE She has no unusual complaints  OBJECTIVE Initial Physical Exam (New OB)   ASSESSMENT Normal pregnancy  PLAN Prenatal care- [facility]

## 2020-03-29 NOTE — Progress Notes (Signed)
Patient seen and assessed by nursing staff during this encounter. I have reviewed the chart and agree with the documentation and plan. I have also made any necessary editorial changes.  Catalina Antigua, MD 03/29/2020 4:10 PM

## 2020-04-04 ENCOUNTER — Other Ambulatory Visit (HOSPITAL_COMMUNITY)
Admission: RE | Admit: 2020-04-04 | Discharge: 2020-04-04 | Disposition: A | Discharge status: Home / Self Care | Payer: Medicaid Other | Source: Ambulatory Visit | Attending: Obstetrics & Gynecology | Admitting: Obstetrics & Gynecology

## 2020-04-04 ENCOUNTER — Other Ambulatory Visit: Payer: Self-pay

## 2020-04-04 ENCOUNTER — Ambulatory Visit (INDEPENDENT_AMBULATORY_CARE_PROVIDER_SITE_OTHER): Payer: Medicaid Other | Admitting: Obstetrics & Gynecology

## 2020-04-04 VITALS — BP 116/80 | HR 90 | Wt 172.0 lb

## 2020-04-04 DIAGNOSIS — O24111 Pre-existing diabetes mellitus, type 2, in pregnancy, first trimester: Secondary | ICD-10-CM

## 2020-04-04 DIAGNOSIS — Z3481 Encounter for supervision of other normal pregnancy, first trimester: Secondary | ICD-10-CM

## 2020-04-04 DIAGNOSIS — Z3480 Encounter for supervision of other normal pregnancy, unspecified trimester: Secondary | ICD-10-CM | POA: Diagnosis present

## 2020-04-04 DIAGNOSIS — Z8751 Personal history of pre-term labor: Secondary | ICD-10-CM | POA: Diagnosis not present

## 2020-04-04 DIAGNOSIS — Z3A11 11 weeks gestation of pregnancy: Secondary | ICD-10-CM

## 2020-04-04 DIAGNOSIS — E119 Type 2 diabetes mellitus without complications: Secondary | ICD-10-CM

## 2020-04-04 MED ORDER — BLOOD PRESSURE KIT DEVI: 1.0000 | 0 refills | Status: AC | PRN

## 2020-04-04 MED ORDER — PANTOPRAZOLE SODIUM 20 MG PO TBEC: 20.0000 mg | DELAYED_RELEASE_TABLET | Freq: Every day | ORAL | 1 refills | Status: DC

## 2020-04-04 MED ORDER — ONDANSETRON 4 MG PO TBDP: 4.0000 mg | ORAL_TABLET | Freq: Four times a day (QID) | ORAL | 0 refills | Status: DC | PRN
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
## 2020-04-04 NOTE — Patient Instructions (Signed)
Gestational Diabetes Mellitus, Diagnosis Gestational diabetes (gestational diabetes mellitus) is a short-term (temporary) form of diabetes that can happen during pregnancy. It goes away after you give birth. It may be caused by one or both of these problems:  Your pancreas does not make enough of a hormone called insulin.  Your body does not respond in a normal way to insulin that it makes. Insulin lets sugars (glucose) go into cells in the body. This gives you energy. If you have diabetes, sugars cannot get into cells. This causes high blood sugar (hyperglycemia). If you get gestational diabetes, you are:  More likely to get it if you get pregnant again.  More likely to develop type 2 diabetes in the future. If gestational diabetes is treated, it may not hurt you or your baby. Your doctor will set treatment goals for you. In general, you should have these blood sugar levels:  After not eating for a long time (fasting): 95 mg/dL (5.3 mmol/L).  After meals (postprandial): ? One hour after a meal: at or below 140 mg/dL (7.8 mmol/L). ? Two hours after a meal: at or below 120 mg/dL (6.7 mmol/L).  A1c (hemoglobin A1c) level: 6-6.5%. Follow these instructions at home: Questions to ask your doctor   You may want to ask these questions: ? Do I need to meet with a diabetes educator? ? What equipment will I need to care for myself at home? ? What medicines do I need? When should I take them? ? How often do I need to check my blood sugar? ? What number can I call if I have questions? ? When is my next doctor's visit? General instructions  Take over-the-counter and prescription medicines only as told by your doctor.  Stay at a healthy weight during pregnancy.  Keep all follow-up visits as told by your doctor. This is important. Contact a doctor if:  Your blood sugar is at or above 240 mg/dL (13.3 mmol/L).  Your blood sugar is at or above 200 mg/dL (11.1 mmol/L) and you have ketones in  your pee (urine).  You have been sick or have had a fever for 2 days or more and you are not getting better.  You have any of these problems for more than 6 hours: ? You cannot eat or drink. ? You feel sick to your stomach (nauseous). ? You throw up (vomit). ? You have watery poop (diarrhea). Get help right away if:  Your blood sugar is lower than 54 mg/dL (3 mmol/L).  You get confused.  You have trouble: ? Thinking clearly. ? Breathing.  Your baby moves less than normal.  You have any of these: ? Moderate or large ketone levels in your pee. ? Blood coming from your vagina. ? Unusual fluid coming from your vagina. ? Early contractions. These may feel like tightness in your belly. Summary  Gestational diabetes is a short-term form of diabetes. It can happen while you are pregnant. It goes away after you give birth.  If gestational diabetes is treated, it may not hurt you or your baby. Your doctor will set treatment goals for you.  Keep all follow-up visits as told by your doctor. This is important. This information is not intended to replace advice given to you by your health care provider. Make sure you discuss any questions you have with your health care provider. Document Revised: 12/25/2017 Document Reviewed: 12/22/2015 Elsevier Patient Education  2020 Elsevier Inc.  

## 2020-04-05 LAB — OBSTETRIC PANEL, INCLUDING HIV
Antibody Screen: NEGATIVE
Basophils Absolute: 0 x10E3/uL (ref 0.0–0.2)
Basos: 0 %
EOS (ABSOLUTE): 0 x10E3/uL (ref 0.0–0.4)
Eos: 0 %
HIV Screen 4th Generation wRfx: NONREACTIVE
Hematocrit: 37.3 % (ref 34.0–46.6)
Hemoglobin: 12.3 g/dL (ref 11.1–15.9)
Hepatitis B Surface Ag: NEGATIVE
Immature Grans (Abs): 0 x10E3/uL (ref 0.0–0.1)
Immature Granulocytes: 0 %
Lymphocytes Absolute: 1.7 x10E3/uL (ref 0.7–3.1)
Lymphs: 17 %
MCH: 30.6 pg (ref 26.6–33.0)
MCHC: 33 g/dL (ref 31.5–35.7)
MCV: 93 fL (ref 79–97)
Monocytes Absolute: 0.6 x10E3/uL (ref 0.1–0.9)
Monocytes: 6 %
Neutrophils Absolute: 7.5 x10E3/uL — ABNORMAL HIGH (ref 1.4–7.0)
Neutrophils: 77 %
Platelets: 342 x10E3/uL (ref 150–450)
RBC: 4.02 x10E6/uL (ref 3.77–5.28)
RDW: 12.1 % (ref 11.7–15.4)
RPR Ser Ql: NONREACTIVE
Rh Factor: NEGATIVE
Rubella Antibodies, IGG: 1.73 {index}
WBC: 9.9 x10E3/uL (ref 3.4–10.8)

## 2020-04-05 LAB — CERVICOVAGINAL ANCILLARY ONLY
Bacterial Vaginitis (gardnerella): NEGATIVE
Candida Glabrata: NEGATIVE
Candida Vaginitis: NEGATIVE
Chlamydia: NEGATIVE
Comment: NEGATIVE
Comment: NEGATIVE
Comment: NEGATIVE
Comment: NEGATIVE
Comment: NEGATIVE
Comment: NORMAL
Neisseria Gonorrhea: NEGATIVE
Trichomonas: NEGATIVE

## 2020-04-05 LAB — HEMOGLOBIN A1C
Est. average glucose Bld gHb Est-mCnc: 100 mg/dL
Hgb A1c MFr Bld: 5.1 % (ref 4.8–5.6)

## 2020-04-06 LAB — CYTOLOGY - PAP
Comment: NEGATIVE
Diagnosis: NEGATIVE
High risk HPV: NEGATIVE

## 2020-04-10 ENCOUNTER — Encounter: Payer: Self-pay | Admitting: Obstetrics and Gynecology

## 2020-04-17 ENCOUNTER — Encounter: Payer: Self-pay | Admitting: Obstetrics and Gynecology

## 2020-04-17 DIAGNOSIS — Z3480 Encounter for supervision of other normal pregnancy, unspecified trimester: Secondary | ICD-10-CM

## 2020-05-02 ENCOUNTER — Telehealth: Payer: Medicaid Other | Admitting: Obstetrics & Gynecology

## 2020-05-02 ENCOUNTER — Telehealth: Payer: Self-pay

## 2020-05-02 NOTE — Telephone Encounter (Signed)
Unable to reach pt for video visit LVM x2 for pt to c/b

## 2020-05-11 ENCOUNTER — Telehealth: Payer: Medicaid Other | Admitting: Obstetrics & Gynecology

## 2020-05-30 ENCOUNTER — Other Ambulatory Visit: Payer: Medicaid Other

## 2020-05-30 ENCOUNTER — Ambulatory Visit: Payer: Medicaid Other

## 2021-01-22 ENCOUNTER — Other Ambulatory Visit: Payer: Self-pay | Admitting: Orthopedic Surgery

## 2021-01-22 DIAGNOSIS — S83421A Sprain of lateral collateral ligament of right knee, initial encounter: Secondary | ICD-10-CM

## 2021-02-11 ENCOUNTER — Other Ambulatory Visit: Payer: Medicaid Other

## 2021-02-25 ENCOUNTER — Other Ambulatory Visit: Payer: Self-pay

## 2021-02-25 ENCOUNTER — Ambulatory Visit
Admission: RE | Admit: 2021-02-25 | Discharge: 2021-02-25 | Disposition: A | Discharge status: Home / Self Care | Payer: Medicaid Other | Source: Ambulatory Visit | Attending: Orthopedic Surgery | Admitting: Orthopedic Surgery

## 2021-02-25 DIAGNOSIS — S83421A Sprain of lateral collateral ligament of right knee, initial encounter: Secondary | ICD-10-CM

## 2022-09-10 ENCOUNTER — Ambulatory Visit: Payer: Self-pay | Admitting: Nurse Practitioner

## 2022-11-26 ENCOUNTER — Encounter: Payer: Self-pay | Admitting: Family Medicine

## 2022-11-26 ENCOUNTER — Ambulatory Visit: Payer: Medicaid Other | Admitting: Nurse Practitioner

## 2022-12-01 ENCOUNTER — Inpatient Hospital Stay (HOSPITAL_COMMUNITY)
Admission: AD | Admit: 2022-12-01 | Discharge: 2022-12-01 | Disposition: A | Discharge status: Home / Self Care | Payer: Medicaid Other | Attending: Obstetrics and Gynecology | Admitting: Obstetrics and Gynecology

## 2022-12-01 ENCOUNTER — Encounter (HOSPITAL_COMMUNITY): Payer: Self-pay

## 2022-12-01 DIAGNOSIS — G44221 Chronic tension-type headache, intractable: Secondary | ICD-10-CM | POA: Diagnosis not present

## 2022-12-01 DIAGNOSIS — Z3201 Encounter for pregnancy test, result positive: Secondary | ICD-10-CM | POA: Diagnosis present

## 2022-12-01 DIAGNOSIS — R0981 Nasal congestion: Secondary | ICD-10-CM | POA: Insufficient documentation

## 2022-12-01 LAB — POCT PREGNANCY, URINE: Preg Test, Ur: POSITIVE — AB

## 2022-12-01 MED ORDER — CETIRIZINE HCL 10 MG PO TABS: 10.0000 mg | ORAL_TABLET | Freq: Every day | ORAL | 0 refills | Status: AC

## 2022-12-01 NOTE — [facility] Provider Note (Signed)
Event Date/Time   First Provider Initiated Contact with Patient 12/01/22 1157      [name] is a 36 y.o. [id number] patient in early pregnancy. When asked to identify her primary concern she states she was told by [facility] that she needed to have her pregnancy verified at either [facility] or [facility] before she could be scheduled for prenatal care.  Patient also reports chronic sinus congestion and headache. She states she typically manages her symptoms with Zyrtec but is out of pills. She endorses trying multiple other OTC medications with limited success. She denies cough, SOB, chest pain, weakness, fever or syncope. She agrees that her current symptoms are chronic in nature.  She denies abdominal pain, vaginal bleeding, dysuria, and abnormal vaginal discharge.  O BP 105/80 (BP Location: Right Arm)   Pulse 93   Temp 98.3 F (36.8 C) (Oral)   Resp 17   LMP 10/23/2022   SpO2 100%   Physical Exam Vitals and nursing note reviewed. Exam conducted with a chaperone present.  Constitutional:      General: She is not in acute distress.    Appearance: Normal appearance. She is not ill-appearing.  HENT:     Nose: Nose normal.     Mouth/Throat:     Mouth: Mucous membranes are moist.     Pharynx: No oropharyngeal exudate.  Cardiovascular:     Rate and Rhythm: Normal rate and regular rhythm.     Pulses: Normal pulses.     Heart sounds: Normal heart sounds.  Pulmonary:     Effort: Pulmonary effort is normal.     Breath sounds: Normal breath sounds.  Abdominal:     General: Abdomen is flat.  Skin:    Capillary Refill: Capillary refill takes less than 2 seconds.  Neurological:     Mental Status: She is alert and oriented to person, place, and time.  Psychiatric:        Mood and Affect: Mood normal.        Behavior: Behavior normal.        Thought Content: Thought content normal.        Judgment: Judgment normal.     A Medical screening exam complete Apology offered to  patient: [facility] offices offer walk-in pregnancy confirmation via UPT per policy No pregnancy-related complaints at this time to indicate emergent workup/ultrasound No change in acuity from chronic symptoms to necessitate emergent workup Patient given list of safe medications in pregnancy, review of changes in symptoms which would indicate return to MAU  Meds ordered this encounter  Medications   cetirizine (ZYRTEC ALLERGY) 10 MG tablet    Sig: Take 1 tablet (10 mg total) by mouth daily.    Dispense:  30 tablet    Refill:  0    Order Specific Question:   Supervising Provider    Answer:   Mariel Aloe A [[id number]]   P Given hard copy pregnancy verification letter to facilitate scheduling prenatal care Discharge from MAU in stable condition Patient may return to [facility]U as needed for pregnancy-related concerns  Clayton Bibles, MSA, MSN, CNM 12/01/2022 1:08 PM

## 2022-12-01 NOTE — Discharge Instructions (Signed)

## 2022-12-01 NOTE — MAU Note (Signed)
Pt reports to mau with c/o ongoing headache for past 2 weeks.  Pt states she has long hx of sinus/allergy issues.  Reports otc meds are not working.  +HPT on 12/12.  Denies vag bleeding or abd pain.

## 2023-03-06 ENCOUNTER — Ambulatory Visit: Payer: BC Managed Care – PPO | Admitting: Family Medicine

## 2023-05-07 LAB — TSH: TSH: 2.8 (ref 0.41–5.90)

## 2023-08-26 ENCOUNTER — Ambulatory Visit (INDEPENDENT_AMBULATORY_CARE_PROVIDER_SITE_OTHER): Payer: BC Managed Care – PPO | Admitting: Nurse Practitioner

## 2023-08-26 ENCOUNTER — Encounter: Payer: Self-pay | Admitting: Nurse Practitioner

## 2023-08-26 VITALS — BP 113/73 | HR 92 | Temp 98.2°F | Ht 69.0 in | Wt 164.6 lb

## 2023-08-26 DIAGNOSIS — Z0000 Encounter for general adult medical examination without abnormal findings: Secondary | ICD-10-CM | POA: Insufficient documentation

## 2023-08-26 DIAGNOSIS — E11649 Type 2 diabetes mellitus with hypoglycemia without coma: Secondary | ICD-10-CM | POA: Diagnosis not present

## 2023-08-26 DIAGNOSIS — R200 Anesthesia of skin: Secondary | ICD-10-CM | POA: Diagnosis not present

## 2023-08-26 DIAGNOSIS — J0110 Acute frontal sinusitis, unspecified: Secondary | ICD-10-CM

## 2023-08-26 DIAGNOSIS — Z0001 Encounter for general adult medical examination with abnormal findings: Secondary | ICD-10-CM

## 2023-08-26 DIAGNOSIS — R202 Paresthesia of skin: Secondary | ICD-10-CM | POA: Diagnosis not present

## 2023-08-26 LAB — BAYER DCA HB A1C WAIVED: HB A1C (BAYER DCA - WAIVED): 5.2 % (ref 4.8–5.6)

## 2023-08-26 MED ORDER — AZITHROMYCIN 250 MG PO TABS
ORAL_TABLET | ORAL | 0 refills | Status: DC
Start: 2023-08-26 — End: 2024-08-04
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
## 2023-08-27 LAB — CMP14+EGFR
ALT: 11 IU/L (ref 0–32)
AST: 11 IU/L (ref 0–40)
Albumin: 4.7 g/dL (ref 3.9–4.9)
Alkaline Phosphatase: 79 IU/L (ref 44–121)
BUN/Creatinine Ratio: 10 (ref 9–23)
BUN: 8 mg/dL (ref 6–20)
Bilirubin Total: 0.6 mg/dL (ref 0.0–1.2)
CO2: 24 mmol/L (ref 20–29)
Calcium: 9 mg/dL (ref 8.7–10.2)
Chloride: 101 mmol/L (ref 96–106)
Creatinine, Ser: 0.83 mg/dL (ref 0.57–1.00)
Globulin, Total: 2.5 g/dL (ref 1.5–4.5)
Glucose: 89 mg/dL (ref 70–99)
Potassium: 3.9 mmol/L (ref 3.5–5.2)
Sodium: 142 mmol/L (ref 134–144)
Total Protein: 7.2 g/dL (ref 6.0–8.5)
eGFR: 93 mL/min/{1.73_m2} (ref >59)

## 2023-08-27 LAB — THYROID PANEL WITH TSH
Free Thyroxine Index: 1.8 (ref 1.2–4.9)
T3 Uptake Ratio: 24 % (ref 24–39)
T4, Total: 7.4 ug/dL (ref 4.5–12.0)
TSH: 2.96 u[IU]/mL (ref 0.450–4.500)

## 2023-08-27 LAB — CBC WITH DIFFERENTIAL/PLATELET
Basophils Absolute: 0 10*3/uL (ref 0.0–0.2)
Basos: 0 %
EOS (ABSOLUTE): 0.1 10*3/uL (ref 0.0–0.4)
Eos: 1 %
Hematocrit: 42.6 % (ref 34.0–46.6)
Hemoglobin: 13.8 g/dL (ref 11.1–15.9)
Immature Grans (Abs): 0 10*3/uL (ref 0.0–0.1)
Immature Granulocytes: 1 %
Lymphocytes Absolute: 1.7 10*3/uL (ref 0.7–3.1)
Lymphs: 27 %
MCH: 28.3 pg (ref 26.6–33.0)
MCHC: 32.4 g/dL (ref 31.5–35.7)
MCV: 87 fL (ref 79–97)
Monocytes Absolute: 0.4 10*3/uL (ref 0.1–0.9)
Monocytes: 7 %
Neutrophils Absolute: 4.3 10*3/uL (ref 1.4–7.0)
Neutrophils: 64 %
Platelets: 294 10*3/uL (ref 150–450)
RBC: 4.88 x10E6/uL (ref 3.77–5.28)
RDW: 13.6 % (ref 11.7–15.4)
WBC: 6.6 10*3/uL (ref 3.4–10.8)

## 2023-08-27 LAB — MICROALBUMIN / CREATININE URINE RATIO
Creatinine, Urine: 129.8 mg/dL
Microalb/Creat Ratio: 14 mg/g creat (ref 0–29)
Microalbumin, Urine: 17.7 ug/mL

## 2023-08-27 LAB — LIPID PANEL
Chol/HDL Ratio: 3.6 ratio (ref 0.0–4.4)
Cholesterol, Total: 162 mg/dL (ref 100–199)
HDL: 45 mg/dL (ref >39)
LDL Chol Calc (NIH): 93 mg/dL (ref 0–99)
Triglycerides: 136 mg/dL (ref 0–149)
VLDL Cholesterol Cal: 24 mg/dL (ref 5–40)

## 2023-08-27 LAB — VITAMIN B12: Vitamin B-12: 433 pg/mL (ref 232–1245)

## 2023-08-28 ENCOUNTER — Ambulatory Visit: Payer: BC Managed Care – PPO

## 2023-10-28 ENCOUNTER — Telehealth: Payer: Self-pay | Admitting: Family Medicine

## 2023-10-28 NOTE — Telephone Encounter (Unsigned)
Copied from CRM [id number]. Topic: General - Other >> Oct 28, 2023  3:12 PM Colletta Maryland S wrote: Reason for CRM: pt would like a call back regarding cost of health certificate form and completion of form

## 2023-10-29 NOTE — Telephone Encounter (Signed)
Left detailed message making patient aware that if it is a physical type of form, then the fee is $20 and we cannot fill it out until fee has been paid.
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
## 2023-11-27 ENCOUNTER — Ambulatory Visit: Payer: BC Managed Care – PPO | Admitting: Nurse Practitioner

## 2023-12-01 ENCOUNTER — Encounter: Payer: Self-pay | Admitting: Nurse Practitioner

## 2024-06-30 LAB — TSH: TSH: 19.11 — AB (ref 0.41–5.90)

## 2024-08-04 ENCOUNTER — Ambulatory Visit (INDEPENDENT_AMBULATORY_CARE_PROVIDER_SITE_OTHER): Admitting: Nurse Practitioner

## 2024-08-04 ENCOUNTER — Encounter: Payer: Self-pay | Admitting: Nurse Practitioner

## 2024-08-04 VITALS — BP 118/74 | HR 102 | Ht 68.0 in | Wt 181.0 lb

## 2024-08-04 DIAGNOSIS — E063 Autoimmune thyroiditis: Secondary | ICD-10-CM

## 2024-08-04 NOTE — Patient Instructions (Signed)
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
## 2024-08-18 ENCOUNTER — Other Ambulatory Visit

## 2024-08-19 ENCOUNTER — Ambulatory Visit: Payer: Self-pay | Admitting: Nurse Practitioner

## 2024-08-19 DIAGNOSIS — E063 Autoimmune thyroiditis: Secondary | ICD-10-CM

## 2024-08-19 LAB — TSH: TSH: 8.87 u[IU]/mL — ABNORMAL HIGH (ref 0.450–4.500)

## 2024-08-19 LAB — T4, FREE: Free T4: 0.95 ng/dL (ref 0.82–1.77)

## 2024-08-19 MED ORDER — LEVOTHYROXINE SODIUM 50 MCG PO TABS: 50.0000 ug | ORAL_TABLET | Freq: Every day | ORAL | 1 refills | Status: DC

## 2024-08-19 NOTE — Telephone Encounter (Signed)
 See pt response.

## 2024-09-15 ENCOUNTER — Telehealth: Payer: Self-pay

## 2024-09-15 ENCOUNTER — Other Ambulatory Visit: Payer: Self-pay | Admitting: Nurse Practitioner

## 2024-09-15 DIAGNOSIS — E063 Autoimmune thyroiditis: Secondary | ICD-10-CM

## 2024-09-15 NOTE — Telephone Encounter (Signed)
 Copied from CRM [id number]. Topic: Clinical - Request for Lab/Test Order >> Sep 15, 2024  3:13 PM Sabrina Griffin wrote: Reason for CRM: Patient requesting to have labs to check her thyroid  per her endocrinologist. Please contact for scheduling

## 2024-09-15 NOTE — Telephone Encounter (Signed)
 Left vm for pt to call back or respond to mychart message that was also sent.

## 2024-09-23 NOTE — Telephone Encounter (Signed)
Lab appointment has been made.

## 2024-11-15 ENCOUNTER — Ambulatory Visit: Admitting: Nurse Practitioner

## 2024-11-15 ENCOUNTER — Other Ambulatory Visit: Payer: Self-pay

## 2024-11-16 LAB — TSH: TSH: 4.57 u[IU]/mL — ABNORMAL HIGH (ref 0.450–4.500)

## 2024-11-16 LAB — T4, FREE: Free T4: 1.21 ng/dL (ref 0.82–1.77)

## 2024-11-22 NOTE — Patient Instructions (Incomplete)

## 2024-11-23 ENCOUNTER — Telehealth: Payer: Self-pay | Admitting: Nurse Practitioner

## 2024-11-23 ENCOUNTER — Ambulatory Visit: Admitting: Nurse Practitioner

## 2024-11-23 DIAGNOSIS — E063 Autoimmune thyroiditis: Secondary | ICD-10-CM

## 2024-11-23 MED ORDER — LEVOTHYROXINE SODIUM 75 MCG PO TABS: 75.0000 ug | ORAL_TABLET | Freq: Every day | ORAL | 0 refills | Status: DC

## 2024-11-23 NOTE — Telephone Encounter (Signed)
 Yes, she will need repeat labs.  It does appear she likely needs increase in her thyroid  dose, based on her recent labs.  We are getting closer to goal, but not quite there yet.  If she is ok with it, I will send in a higher dose (75 mcg) for her to start now and repeat the labs before next visit.

## 2024-11-23 NOTE — Telephone Encounter (Signed)
 Pt had to reschedule appt is moved to 01/12/25.  Does she need to repeat labs and she would like to know what needs to happen to go over her results?

## 2024-11-29 MED ORDER — LEVOTHYROXINE SODIUM 75 MCG PO TABS: 75.0000 ug | ORAL_TABLET | Freq: Every day | ORAL | 0 refills | Status: DC

## 2024-11-29 NOTE — Addendum Note (Signed)
 Addended by: Mikiah Demond M on: 11/29/2024 11:33 AM   Modules accepted: Orders

## 2024-11-30 ENCOUNTER — Other Ambulatory Visit: Payer: Self-pay | Admitting: *Deleted

## 2024-11-30 DIAGNOSIS — E063 Autoimmune thyroiditis: Secondary | ICD-10-CM

## 2024-11-30 MED ORDER — LEVOTHYROXINE SODIUM 75 MCG PO TABS: 75.0000 ug | ORAL_TABLET | Freq: Every day | ORAL | 0 refills | Status: AC

## 2024-11-30 NOTE — Telephone Encounter (Signed)
 Patient left a voicemail on 11/29/24. She was concerned as her Synthroid  gets sent to the wrong pharmacy. It needs to go to [facility] in [place], [place]

## 2024-12-03 ENCOUNTER — Encounter: Payer: Self-pay | Admitting: Family Medicine

## 2024-12-03 ENCOUNTER — Ambulatory Visit: Admitting: Family Medicine

## 2024-12-03 ENCOUNTER — Telehealth: Payer: Self-pay | Admitting: Nurse Practitioner

## 2024-12-03 ENCOUNTER — Ambulatory Visit: Payer: Self-pay | Admitting: Family Medicine

## 2024-12-03 VITALS — BP 133/90 | HR 97 | Temp 97.2°F | Ht 68.0 in | Wt 179.2 lb

## 2024-12-03 DIAGNOSIS — N3001 Acute cystitis with hematuria: Secondary | ICD-10-CM | POA: Diagnosis not present

## 2024-12-03 DIAGNOSIS — R350 Frequency of micturition: Secondary | ICD-10-CM | POA: Diagnosis not present

## 2024-12-03 DIAGNOSIS — R3989 Other symptoms and signs involving the genitourinary system: Secondary | ICD-10-CM | POA: Diagnosis not present

## 2024-12-03 DIAGNOSIS — R1024 Suprapubic pain: Secondary | ICD-10-CM

## 2024-12-03 LAB — MICROSCOPIC EXAMINATION
RBC, Urine: NONE SEEN /HPF (ref 0–2)
Renal Epithel, UA: NONE SEEN /HPF
Yeast, UA: NONE SEEN

## 2024-12-03 LAB — URINALYSIS, ROUTINE W REFLEX MICROSCOPIC
Glucose, UA: NEGATIVE
Nitrite, UA: NEGATIVE
Specific Gravity, UA: 1.025 (ref 1.005–1.030)
Urobilinogen, Ur: 0.2 mg/dL (ref 0.2–1.0)
pH, UA: 5.5 (ref 5.0–7.5)

## 2024-12-03 MED ORDER — SULFAMETHOXAZOLE-TRIMETHOPRIM 800-160 MG PO TABS: 1.0000 | ORAL_TABLET | Freq: Two times a day (BID) | ORAL | 0 refills | Status: AC
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
## 2024-12-03 NOTE — Telephone Encounter (Signed)
 Pt would like to change PCP from [facility] to [facility] Please advise

## 2024-12-05 LAB — URINE CULTURE

## 2024-12-09 NOTE — Telephone Encounter (Signed)
 Appt scheduled for 06/29/2025

## 2024-12-31 ENCOUNTER — Telehealth: Payer: Self-pay | Admitting: *Deleted
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
## 2025-01-03 ENCOUNTER — Other Ambulatory Visit

## 2025-01-04 ENCOUNTER — Other Ambulatory Visit

## 2025-01-05 LAB — T4, FREE: Free T4: 1.16 ng/dL (ref 0.82–1.77)

## 2025-01-05 LAB — TSH: TSH: 4.54 u[IU]/mL — ABNORMAL HIGH (ref 0.450–4.500)

## 2025-01-12 ENCOUNTER — Ambulatory Visit: Admitting: Nurse Practitioner

## 2025-06-29 ENCOUNTER — Encounter: Admitting: Family Medicine
